# Patient Record
Sex: Male | Born: 1955 | Race: Black or African American | Hispanic: No | Marital: Married | Smoking: Current every day smoker
Health system: Southern US, Community
[De-identification: ages and names within clinical notes are randomized; demographics above are authoritative.]

## PROBLEM LIST (undated history)

## (undated) DIAGNOSIS — E785 Hyperlipidemia, unspecified: Secondary | ICD-10-CM

## (undated) HISTORY — DX: Hyperlipidemia, unspecified: E78.5

## (undated) HISTORY — PX: LIPOMA EXCISION: SHX5283

---

## 2000-03-11 ENCOUNTER — Emergency Department (HOSPITAL_COMMUNITY): Admission: EM | Admit: 2000-03-11 | Discharge: 2000-03-11 | Payer: Self-pay | Admitting: Emergency Medicine

## 2000-07-12 ENCOUNTER — Emergency Department (HOSPITAL_COMMUNITY): Admission: EM | Admit: 2000-07-12 | Discharge: 2000-07-12 | Payer: Self-pay | Admitting: Emergency Medicine

## 2000-07-27 ENCOUNTER — Emergency Department (HOSPITAL_COMMUNITY): Admission: EM | Admit: 2000-07-27 | Discharge: 2000-07-27 | Payer: Self-pay | Admitting: Emergency Medicine

## 2001-03-13 ENCOUNTER — Emergency Department (HOSPITAL_COMMUNITY): Admission: EM | Admit: 2001-03-13 | Discharge: 2001-03-13 | Payer: Self-pay | Admitting: *Deleted

## 2002-02-13 ENCOUNTER — Encounter: Payer: Self-pay | Admitting: Emergency Medicine

## 2002-02-13 ENCOUNTER — Emergency Department (HOSPITAL_COMMUNITY): Admission: EM | Admit: 2002-02-13 | Discharge: 2002-02-13 | Payer: Self-pay | Admitting: Emergency Medicine

## 2005-12-14 ENCOUNTER — Emergency Department (HOSPITAL_COMMUNITY): Admission: EM | Admit: 2005-12-14 | Discharge: 2005-12-14 | Payer: Self-pay | Admitting: Emergency Medicine

## 2007-04-07 ENCOUNTER — Encounter (HOSPITAL_BASED_OUTPATIENT_CLINIC_OR_DEPARTMENT_OTHER): Payer: Self-pay | Admitting: General Surgery

## 2007-04-07 ENCOUNTER — Ambulatory Visit (HOSPITAL_BASED_OUTPATIENT_CLINIC_OR_DEPARTMENT_OTHER): Admission: RE | Admit: 2007-04-07 | Discharge: 2007-04-07 | Payer: Self-pay | Admitting: General Surgery

## 2007-04-09 ENCOUNTER — Emergency Department (HOSPITAL_COMMUNITY): Admission: EM | Admit: 2007-04-09 | Discharge: 2007-04-09 | Payer: Self-pay | Admitting: Emergency Medicine

## 2007-04-19 ENCOUNTER — Emergency Department (HOSPITAL_COMMUNITY): Admission: EM | Admit: 2007-04-19 | Discharge: 2007-04-19 | Payer: Self-pay | Admitting: Emergency Medicine

## 2008-10-17 ENCOUNTER — Emergency Department (HOSPITAL_COMMUNITY): Admission: EM | Admit: 2008-10-17 | Discharge: 2008-10-17 | Payer: Self-pay | Admitting: Emergency Medicine

## 2009-08-02 ENCOUNTER — Emergency Department (HOSPITAL_COMMUNITY): Admission: EM | Admit: 2009-08-02 | Discharge: 2009-08-02 | Payer: Self-pay | Admitting: Emergency Medicine

## 2010-03-10 ENCOUNTER — Emergency Department (HOSPITAL_COMMUNITY): Admission: EM | Admit: 2010-03-10 | Discharge: 2010-03-10 | Payer: Self-pay | Admitting: Emergency Medicine

## 2010-04-29 ENCOUNTER — Encounter: Admission: RE | Admit: 2010-04-29 | Discharge: 2010-04-29 | Payer: Self-pay | Admitting: Otolaryngology

## 2010-08-19 ENCOUNTER — Emergency Department (HOSPITAL_COMMUNITY)
Admission: EM | Admit: 2010-08-19 | Discharge: 2010-08-19 | Disposition: A | Discharge status: Home / Self Care | Payer: Medicaid Other | Attending: Emergency Medicine | Admitting: Emergency Medicine

## 2010-08-19 DIAGNOSIS — R5381 Other malaise: Secondary | ICD-10-CM | POA: Insufficient documentation

## 2010-08-19 DIAGNOSIS — R109 Unspecified abdominal pain: Secondary | ICD-10-CM | POA: Insufficient documentation

## 2010-08-19 DIAGNOSIS — R197 Diarrhea, unspecified: Secondary | ICD-10-CM | POA: Insufficient documentation

## 2010-08-19 LAB — POCT I-STAT, CHEM 8
BUN: 12 mg/dL (ref 6–23)
Calcium, Ion: 1.11 mmol/L — ABNORMAL LOW (ref 1.12–1.32)
Chloride: 105 mEq/L (ref 96–112)
Creatinine, Ser: 1.2 mg/dL (ref 0.4–1.5)
Glucose, Bld: 88 mg/dL (ref 70–99)
HCT: 46 % (ref 39.0–52.0)
Hemoglobin: 15.6 g/dL (ref 13.0–17.0)
Potassium: 3.8 mEq/L (ref 3.5–5.1)
TCO2: 22 mmol/L (ref 0–100)

## 2010-10-22 LAB — DIFFERENTIAL
Eosinophils Absolute: 0.2 10*3/uL (ref 0.0–0.7)
Lymphocytes Relative: 23 % (ref 12–46)
Lymphs Abs: 3.1 10*3/uL (ref 0.7–4.0)
Monocytes Relative: 7 % (ref 3–12)
Neutro Abs: 9.3 10*3/uL — ABNORMAL HIGH (ref 1.7–7.7)
Neutrophils Relative %: 68 % (ref 43–77)

## 2010-10-22 LAB — CBC
MCV: 91.6 fL (ref 78.0–100.0)
Platelets: 310 10*3/uL (ref 150–400)
RBC: 4.21 MIL/uL — ABNORMAL LOW (ref 4.22–5.81)
WBC: 13.6 10*3/uL — ABNORMAL HIGH (ref 4.0–10.5)

## 2010-10-22 LAB — URINALYSIS, ROUTINE W REFLEX MICROSCOPIC
Bilirubin Urine: NEGATIVE
Glucose, UA: NEGATIVE mg/dL
Ketones, ur: NEGATIVE mg/dL
Nitrite: NEGATIVE
Specific Gravity, Urine: 1.018 (ref 1.005–1.030)
pH: 5.5 (ref 5.0–8.0)

## 2010-10-22 LAB — BASIC METABOLIC PANEL
BUN: 12 mg/dL (ref 6–23)
Chloride: 104 mEq/L (ref 96–112)
Creatinine, Ser: 0.84 mg/dL (ref 0.4–1.5)
GFR calc Af Amer: >60 mL/min (ref >60)
GFR calc non Af Amer: >60 mL/min (ref >60)

## 2010-10-22 LAB — ABO/RH: ABO/RH(D): O POS

## 2010-10-22 LAB — HEMOCCULT GUIAC POC 1CARD (OFFICE): Fecal Occult Bld: POSITIVE

## 2010-10-22 LAB — TYPE AND SCREEN: Antibody Screen: NEGATIVE

## 2010-10-22 LAB — PROTIME-INR
INR: 1 (ref 0.00–1.49)
Prothrombin Time: 13.7 seconds (ref 11.6–15.2)

## 2010-10-22 LAB — APTT: aPTT: 53 seconds — ABNORMAL HIGH (ref 24–37)

## 2010-11-25 NOTE — Op Note (Signed)
NAMECREIGHTON, LONGLEY                  ACCOUNT NO.:  [id number]   MEDICAL RECORD NO.:  [id number]          PATIENT TYPE:  AMB   LOCATION:  DSC                          FACILITY:  [facility]   PHYSICIAN:  Leonie Man, M.D.   DATE OF BIRTH:  October 24, 1955   DATE OF PROCEDURE:  04/07/2007  DATE OF DISCHARGE:                               OPERATIVE REPORT   PREOPERATIVE DIAGNOSIS:  Lipoma, posterior right shoulder.   POSTOPERATIVE DIAGNOSIS:  Lipoma, posterior right shoulder.   PROCEDURE:  Excision of lipoma, posterior right shoulder, estimated at  27 cm in greatest diameter.   SURGEON:  Leonie Man, M.D.   ASSISTANT:  OR nurse.   ANESTHESIA:  General.   INDICATION:  The patient is a 55 year old man with a very enlarging mass  over his right shoulder posteriorly which is now causing some pressure  symptoms and does not allow his clothing to fit correctly.  He comes to  the operating room now for removal of this very large lipoma after the  risks and potential benefits of surgery have been discussed, all  questions answered and consent obtained.   PROCEDURE:  The patient was positioned in the prone position following  induction of satisfactory anesthesia and his posterior right shoulder  was prepped and draped to be included in a sterile operative field.  Positive identification of the patient as Antonio Ball and the operation  as a right-sided posterior shoulder lipoma was carried out.  I then made  a transverse incision across the lipoma, deepening this through skin and  subcutaneous tissue and down to the capsule of the lipoma.  The capsule  was opened the entire length of the incision and a finger was inserted  into the capsule of the lipoma and using blunt dissection, most of this  was dissected free.  Hemostasis was assured with electrocautery.  Sponge  and instrument counts were verified.  The incision was closed in layers  using interrupted 3-0 Vicryl sutures to close the  subcutaneous tissue  and a running 4-0 Monocryl to close the skin.  The anesthetic was  reversed after sterile dressings were placed on the wound and the  patient removed from the operating room to the recovery room in stable  condition.  He tolerated the procedure well.      Leonie Man, M.D.  Electronically Signed     PB/MEDQ  D:  04/07/2007  T:  04/08/2007  Job:  [id number]

## 2011-02-05 ENCOUNTER — Emergency Department (HOSPITAL_COMMUNITY)
Admission: EM | Admit: 2011-02-05 | Discharge: 2011-02-05 | Disposition: A | Discharge status: Home / Self Care | Payer: Medicaid Other | Attending: Emergency Medicine | Admitting: Emergency Medicine

## 2011-02-05 DIAGNOSIS — L723 Sebaceous cyst: Secondary | ICD-10-CM | POA: Insufficient documentation

## 2011-04-23 LAB — POCT HEMOGLOBIN-HEMACUE
Hemoglobin: 14.7
Operator id: 141661

## 2012-03-24 ENCOUNTER — Encounter (INDEPENDENT_AMBULATORY_CARE_PROVIDER_SITE_OTHER): Payer: Self-pay | Admitting: Surgery

## 2012-04-12 ENCOUNTER — Ambulatory Visit (INDEPENDENT_AMBULATORY_CARE_PROVIDER_SITE_OTHER): Payer: Medicaid Other | Admitting: Surgery

## 2012-04-12 ENCOUNTER — Encounter (INDEPENDENT_AMBULATORY_CARE_PROVIDER_SITE_OTHER): Payer: Self-pay | Admitting: Surgery

## 2012-04-12 VITALS — BP 114/64 | HR 92 | Temp 97.5°F | Resp 20 | Ht 68.5 in | Wt 169.8 lb

## 2012-04-12 DIAGNOSIS — N498 Inflammatory disorders of other specified male genital organs: Secondary | ICD-10-CM

## 2012-04-12 DIAGNOSIS — N492 Inflammatory disorders of scrotum: Secondary | ICD-10-CM

## 2012-04-12 NOTE — Progress Notes (Signed)
Patient ID: Antonio Ball, male   DOB: April 04, 1956, 57 y.o.   MRN: [id number]  Chief Complaint  Patient presents with  . Pre-op Exam    eval lt groin mass    HPI Antonio Ball is a 56 y.o. male.   HPIHe is referred by Dr. Parke Simmers  For evaluation of a nodule near his left side of the scrotum. He is felt that for approximately 8 months. It also slight discomfort and has been getting larger it has never drained. He has no other complaints.  Past Medical History  Diagnosis Date  . Hyperlipidemia     Past Surgical History  Procedure Date  . Lipoma excision     History reviewed. No pertinent family history.  Social History History  Substance Use Topics  . Smoking status: Current Every Day Smoker -- 1.0 packs/day    Types: Cigarettes  . Smokeless tobacco: Not on file  . Alcohol Use: Yes     1 beer a month    No Known Allergies  Current Outpatient Prescriptions  Medication Sig Dispense Refill  . nicotine (NICODERM CQ - DOSED IN MG/24 HOURS) 14 mg/24hr patch Place 1 patch onto the skin daily.        Review of Systems Review of Systems  All other systems reviewed and are negative.    Blood pressure 114/64, pulse 92, temperature 97.5 F (36.4 C), temperature source Temporal, resp. rate 20, height 5' 8.5" (1.74 m), weight 169 lb 12.8 oz (77.021 kg).  Physical Exam Physical Exam  Constitutional: He is oriented to person, place, and time. He appears well-developed and well-nourished. No distress.  HENT:  Head: Normocephalic and atraumatic.  Right Ear: External ear normal.  Left Ear: External ear normal.  Nose: Nose normal.  Mouth/Throat: Oropharynx is clear and moist. No oropharyngeal exudate.  Eyes: Conjunctivae normal are normal. Pupils are equal, round, and reactive to light. Right eye exhibits no discharge. Left eye exhibits no discharge. No scleral icterus.  Neck: Normal range of motion. Neck supple. No tracheal deviation present. No thyromegaly present.    Cardiovascular: Normal rate, regular rhythm, normal heart sounds and intact distal pulses.   No murmur heard. Pulmonary/Chest: Effort normal and breath sounds normal. No respiratory distress. He has no wheezes. He has no rales.  Abdominal: Soft. Bowel sounds are normal. He exhibits no distension. There is no tenderness. There is no rebound and no guarding.  Genitourinary: Penis normal.       There is a 1 cm hard nodule which is mobile at the left lateral side of the scrotum right near the thigh. There is no erythema and minimal tenderness  There are no testicular masses  Musculoskeletal: Normal range of motion. He exhibits no edema and no tenderness.  Lymphadenopathy:    He has no cervical adenopathy.  Neurological: He is alert and oriented to person, place, and time. No cranial nerve deficit. Coordination normal.  Skin: Skin is warm and dry. He is not diaphoretic. No erythema. No pallor.  Psychiatric: His behavior is normal. Judgment normal.    Data Reviewed   Assessment    Left scrotal nodule    Plan    Removal of this is recommended for histologic evaluation to rule out malignancy. I discussed this with him in detail. I discussed the risks of surgery which includes but is not limited to bleeding, infection, injury to Surrounding structures, recurrence, need for further surgery, et Karie Soda. he understands and wishes to proceed. Surgery will be scheduled  [name] A 04/12/2012, 10:33 AM

## 2012-04-13 ENCOUNTER — Encounter (HOSPITAL_COMMUNITY): Payer: Self-pay | Admitting: Pharmacy Technician

## 2012-04-18 NOTE — Pre-Procedure Instructions (Signed)
20 RYAAN VANWAGONER  04/18/2012   Your procedure is scheduled on:  Wednesday, October 9th.  Report to [facility] at 8:15AM.  Call this number if you have problems the morning of surgery: [phone]   Remember:   Do not eat food or drink any liquid:After Midnight.      Take these medicines the morning of surgery with A SIP OF WATER: None   Do not wear jewelry, make-up or nail polish.  Do not wear lotions, powders, or perfumes. You may wear deodorant.  Do not shave 48 hours prior to surgery. Men may shave face and neck.  Do not bring valuables to the hospital.  Contacts, dentures or bridgework may not be worn into surgery.  Leave suitcase in the car. After surgery it may be brought to your room.  For patients admitted to the hospital, checkout time is 11:00 AM the day of discharge.   Patients discharged the day of surgery will not be allowed to drive home.  Name and phone number of your driver: ________________  Special Instructions: Shower using CHG 2 nights before surgery and the night before surgery.  If you shower the day of surgery use CHG.  Use special wash - you have one bottle of CHG for all showers.  You should use approximately 1/3 of the bottle for each shower.   Please read over the following fact sheets that you were given: Pain Booklet, Coughing and Deep Breathing and Surgical Site Infection Prevention

## 2012-04-19 ENCOUNTER — Encounter (HOSPITAL_COMMUNITY): Payer: Self-pay

## 2012-04-19 ENCOUNTER — Encounter (HOSPITAL_COMMUNITY)
Admission: RE | Admit: 2012-04-19 | Discharge: 2012-04-19 | Disposition: A | Discharge status: Home / Self Care | Payer: Medicaid Other | Source: Ambulatory Visit | Attending: Surgery | Admitting: Surgery

## 2012-04-19 ENCOUNTER — Other Ambulatory Visit (INDEPENDENT_AMBULATORY_CARE_PROVIDER_SITE_OTHER): Payer: Self-pay | Admitting: Surgery

## 2012-04-19 LAB — CBC
HCT: 41.9 % (ref 39.0–52.0)
Hemoglobin: 14.1 g/dL (ref 13.0–17.0)
MCH: 30.9 pg (ref 26.0–34.0)
MCHC: 33.7 g/dL (ref 30.0–36.0)
MCV: 91.7 fL (ref 78.0–100.0)
RBC: 4.57 MIL/uL (ref 4.22–5.81)

## 2012-04-19 LAB — SURGICAL PCR SCREEN: MRSA, PCR: NEGATIVE

## 2012-04-19 MED ORDER — CEFAZOLIN SODIUM-DEXTROSE 2-3 GM-% IV SOLR
2.0000 g | INTRAVENOUS | Status: AC
  Administered 2012-04-20: 2 g via INTRAVENOUS

## 2012-04-19 NOTE — H&P (Signed)
Patient ID: Antonio Ball, male DOB: 15-Aug-1955, 56 y.o. MRN: [id number]  Chief Complaint   Patient presents with   .  Pre-op Exam     eval lt groin mass    HPI  Antonio Ball is a 56 y.o. male.  HPIHe is referred by Dr. Parke Simmers For evaluation of a nodule near his left side of the scrotum. He is felt that for approximately 8 months. It also slight discomfort and has been getting larger it has never drained. He has no other complaints.  Past Medical History   Diagnosis  Date   .  Hyperlipidemia     Past Surgical History   Procedure  Date   .  Lipoma excision     History reviewed. No pertinent family history.  Social History  History   Substance Use Topics   .  Smoking status:  Current Every Day Smoker -- 1.0 packs/day     Types:  Cigarettes   .  Smokeless tobacco:  Not on file   .  Alcohol Use:  Yes      1 beer a month    No Known Allergies  Current Outpatient Prescriptions   Medication  Sig  Dispense  Refill   .  nicotine (NICODERM CQ - DOSED IN MG/24 HOURS) 14 mg/24hr patch  Place 1 patch onto the skin daily.      Review of Systems  Review of Systems  All other systems reviewed and are negative.   Blood pressure 114/64, pulse 92, temperature 97.5 F (36.4 C), temperature source Temporal, resp. rate 20, height 5' 8.5" (1.74 m), weight 169 lb 12.8 oz (77.021 kg).  Physical Exam  Physical Exam  Constitutional: He is oriented to person, place, and time. He appears well-developed and well-nourished. No distress.  HENT:  Head: Normocephalic and atraumatic.  Right Ear: External ear normal.  Left Ear: External ear normal.  Nose: Nose normal.  Mouth/Throat: Oropharynx is clear and moist. No oropharyngeal exudate.  Eyes: Conjunctivae normal are normal. Pupils are equal, round, and reactive to light. Right eye exhibits no discharge. Left eye exhibits no discharge. No scleral icterus.  Neck: Normal range of motion. Neck supple. No tracheal deviation present. No thyromegaly  present.  Cardiovascular: Normal rate, regular rhythm, normal heart sounds and intact distal pulses.  No murmur heard.  Pulmonary/Chest: Effort normal and breath sounds normal. No respiratory distress. He has no wheezes. He has no rales.  Abdominal: Soft. Bowel sounds are normal. He exhibits no distension. There is no tenderness. There is no rebound and no guarding.  Genitourinary: Penis normal.  There is a 1 cm hard nodule which is mobile at the left lateral side of the scrotum right near the thigh. There is no erythema and minimal tenderness  There are no testicular masses  Musculoskeletal: Normal range of motion. He exhibits no edema and no tenderness.  Lymphadenopathy:  He has no cervical adenopathy.  Neurological: He is alert and oriented to person, place, and time. No cranial nerve deficit. Coordination normal.  Skin: Skin is warm and dry. He is not diaphoretic. No erythema. No pallor.  Psychiatric: His behavior is normal. Judgment normal.   Data Reviewed  Assessment   Left scrotal nodule   Plan   Removal of this is recommended for histologic evaluation to rule out malignancy. I discussed this with him in detail. I discussed the risks of surgery which includes but is not limited to bleeding, infection, injury to Surrounding structures, recurrence, need for  further surgery, et Karie Soda. he understands and wishes to proceed. Surgery will be scheduled   Antonio Ball A

## 2012-04-20 ENCOUNTER — Ambulatory Visit (HOSPITAL_COMMUNITY)
Admission: RE | Admit: 2012-04-20 | Discharge: 2012-04-20 | Disposition: A | Discharge status: Home / Self Care | Payer: Medicaid Other | Source: Ambulatory Visit | Attending: Surgery | Admitting: Surgery

## 2012-04-20 ENCOUNTER — Encounter (HOSPITAL_COMMUNITY)
Admission: RE | Disposition: A | Discharge status: Home / Self Care | Payer: Self-pay | Source: Ambulatory Visit | Attending: Surgery

## 2012-04-20 ENCOUNTER — Encounter (HOSPITAL_COMMUNITY): Payer: Self-pay | Admitting: Anesthesiology

## 2012-04-20 ENCOUNTER — Ambulatory Visit (HOSPITAL_COMMUNITY): Payer: Medicaid Other | Admitting: Anesthesiology

## 2012-04-20 DIAGNOSIS — F17200 Nicotine dependence, unspecified, uncomplicated: Secondary | ICD-10-CM | POA: Insufficient documentation

## 2012-04-20 DIAGNOSIS — L723 Sebaceous cyst: Secondary | ICD-10-CM | POA: Insufficient documentation

## 2012-04-20 DIAGNOSIS — Z01812 Encounter for preprocedural laboratory examination: Secondary | ICD-10-CM | POA: Insufficient documentation

## 2012-04-20 DIAGNOSIS — E785 Hyperlipidemia, unspecified: Secondary | ICD-10-CM | POA: Insufficient documentation

## 2012-04-20 HISTORY — PX: MASS EXCISION: SHX2000

## 2012-04-20 SURGERY — EXCISION MASS
Anesthesia: Choice | Site: Scrotum | Laterality: Left | Wound class: Clean

## 2012-04-20 MED ORDER — OXYCODONE HCL 5 MG PO TABS
ORAL_TABLET | ORAL | Status: AC
  Filled 2012-04-20: qty 2

## 2012-04-20 MED ORDER — LIDOCAINE HCL (PF) 1 % IJ SOLN
INTRAMUSCULAR | Status: AC
  Filled 2012-04-20: qty 30

## 2012-04-20 MED ORDER — FENTANYL CITRATE 0.05 MG/ML IJ SOLN
INTRAMUSCULAR | Status: DC | PRN
  Administered 2012-04-20: 50 ug via INTRAVENOUS

## 2012-04-20 MED ORDER — 0.9 % SODIUM CHLORIDE (POUR BTL) OPTIME
TOPICAL | Status: DC | PRN
  Administered 2012-04-20: 1000 mL

## 2012-04-20 MED ORDER — BUPIVACAINE-EPINEPHRINE 0.25% -1:200000 IJ SOLN
INTRAMUSCULAR | Status: DC | PRN
  Administered 2012-04-20: 10 mL

## 2012-04-20 MED ORDER — MIDAZOLAM HCL 5 MG/5ML IJ SOLN
INTRAMUSCULAR | Status: DC | PRN
  Administered 2012-04-20: 2 mg via INTRAVENOUS

## 2012-04-20 MED ORDER — PROPOFOL 10 MG/ML IV BOLUS
INTRAVENOUS | Status: DC | PRN
  Administered 2012-04-20: 10 mg via INTRAVENOUS

## 2012-04-20 MED ORDER — LIDOCAINE HCL (PF) 1 % IJ SOLN
INTRAMUSCULAR | Status: DC | PRN
  Administered 2012-04-20: 10 mL

## 2012-04-20 MED ORDER — BUPIVACAINE-EPINEPHRINE PF 0.25-1:200000 % IJ SOLN
INTRAMUSCULAR | Status: AC
  Filled 2012-04-20: qty 30

## 2012-04-20 MED ORDER — LACTATED RINGERS IV SOLN
INTRAVENOUS | Status: DC | PRN
  Administered 2012-04-20: 10:00:00 via INTRAVENOUS

## 2012-04-20 MED ORDER — OXYCODONE HCL 5 MG PO TABS
5.0000 mg | ORAL_TABLET | ORAL | Status: DC | PRN
  Administered 2012-04-20: 10 mg via ORAL

## 2012-04-20 MED ORDER — HYDROMORPHONE HCL PF 1 MG/ML IJ SOLN: 0.2500 mg | INTRAMUSCULAR | Status: DC | PRN

## 2012-04-20 MED ORDER — CEFAZOLIN SODIUM-DEXTROSE 2-3 GM-% IV SOLR
INTRAVENOUS | Status: AC
  Filled 2012-04-20: qty 50

## 2012-04-20 MED ORDER — HYDROCODONE-ACETAMINOPHEN 5-325 MG PO TABS: 1.0000 | ORAL_TABLET | ORAL | Status: DC | PRN

## 2012-04-20 MED ORDER — KETOROLAC TROMETHAMINE 30 MG/ML IJ SOLN
INTRAMUSCULAR | Status: DC | PRN
  Administered 2012-04-20: 30 mg via INTRAVENOUS

## 2012-04-20 MED ORDER — LACTATED RINGERS IV SOLN
INTRAVENOUS | Status: DC
  Administered 2012-04-20: 10:00:00 via INTRAVENOUS

## 2012-04-20 SURGICAL SUPPLY — 40 items
BENZOIN TINCTURE PRP APPL 2/3 (GAUZE/BANDAGES/DRESSINGS) ×2 IMPLANT
BLADE SURG 10 STRL SS (BLADE) ×2 IMPLANT
BLADE SURG 15 STRL LF DISP TIS (BLADE) ×1 IMPLANT
BLADE SURG 15 STRL SS (BLADE) ×1
CANISTER SUCTION 2500CC (MISCELLANEOUS) IMPLANT
CLOTH BEACON ORANGE TIMEOUT ST (SAFETY) ×2 IMPLANT
COVER SURGICAL LIGHT HANDLE (MISCELLANEOUS) ×2 IMPLANT
DECANTER SPIKE VIAL GLASS SM (MISCELLANEOUS) ×2 IMPLANT
DRAPE LAPAROSCOPIC ABDOMINAL (DRAPES) IMPLANT
DRAPE PED LAPAROTOMY (DRAPES) ×2 IMPLANT
ELECT CAUTERY BLADE 6.4 (BLADE) ×2 IMPLANT
ELECT REM PT RETURN 9FT ADLT (ELECTROSURGICAL) ×2
ELECTRODE REM PT RTRN 9FT ADLT (ELECTROSURGICAL) ×1 IMPLANT
GLOVE BIO SURGEON STRL SZ7.5 (GLOVE) ×2 IMPLANT
GLOVE BIOGEL PI IND STRL 7.5 (GLOVE) ×1 IMPLANT
GLOVE BIOGEL PI INDICATOR 7.5 (GLOVE) ×1
GLOVE ECLIPSE 7.5 STRL STRAW (GLOVE) ×2 IMPLANT
GLOVE SURG SIGNA 7.5 PF LTX (GLOVE) ×2 IMPLANT
GOWN PREVENTION PLUS XLARGE (GOWN DISPOSABLE) ×2 IMPLANT
GOWN STRL NON-REIN LRG LVL3 (GOWN DISPOSABLE) ×2 IMPLANT
KIT BASIN OR (CUSTOM PROCEDURE TRAY) ×2 IMPLANT
KIT ROOM TURNOVER OR (KITS) ×2 IMPLANT
NEEDLE HYPO 25X1 1.5 SAFETY (NEEDLE) ×2 IMPLANT
NS IRRIG 1000ML POUR BTL (IV SOLUTION) ×2 IMPLANT
PACK SURGICAL SETUP 50X90 (CUSTOM PROCEDURE TRAY) ×2 IMPLANT
PAD ARMBOARD 7.5X6 YLW CONV (MISCELLANEOUS) ×4 IMPLANT
PENCIL BUTTON HOLSTER BLD 10FT (ELECTRODE) ×2 IMPLANT
SPECIMEN JAR SMALL (MISCELLANEOUS) ×2 IMPLANT
SPONGE GAUZE 4X4 12PLY (GAUZE/BANDAGES/DRESSINGS) ×2 IMPLANT
SPONGE LAP 18X18 X RAY DECT (DISPOSABLE) ×2 IMPLANT
STRIP CLOSURE SKIN 1/2X4 (GAUZE/BANDAGES/DRESSINGS) ×2 IMPLANT
SUT MNCRL AB 4-0 PS2 18 (SUTURE) ×2 IMPLANT
SUT VIC AB 3-0 SH 27 (SUTURE) ×1
SUT VIC AB 3-0 SH 27XBRD (SUTURE) ×1 IMPLANT
SYR BULB 3OZ (MISCELLANEOUS) ×2 IMPLANT
SYR CONTROL 10ML LL (SYRINGE) ×2 IMPLANT
TOWEL OR 17X24 6PK STRL BLUE (TOWEL DISPOSABLE) ×2 IMPLANT
TOWEL OR 17X26 10 PK STRL BLUE (TOWEL DISPOSABLE) ×2 IMPLANT
TUBE CONNECTING 12X1/4 (SUCTIONS) IMPLANT
YANKAUER SUCT BULB TIP NO VENT (SUCTIONS) IMPLANT

## 2012-04-20 NOTE — Interval H&P Note (Signed)
History and Physical Interval Note: no change in H and P  04/20/2012 8:34 AM  Antonio Ball  has presented today for surgery, with the diagnosis of excision left scrotal nodule  The various methods of treatment have been discussed with the patient and family. After consideration of risks, benefits and other options for treatment, the patient has consented to  Procedure(s) (LRB) with comments: EXCISION MASS (Left) - excision left scrotal nodule as a surgical intervention .  The patient's history has been reviewed, patient examined, no change in status, stable for surgery.  I have reviewed the patient's chart and labs.  Questions were answered to the patient's satisfaction.     [name] A

## 2012-04-20 NOTE — Op Note (Signed)
EXCISION MASS  Procedure Note  WINDELL MUSSON 04/20/2012   Pre-op Diagnosis: excision left scrotal nodule     Post-op Diagnosis: same  Procedure(s): Excision 2cm left scrotal nodule  Surgeon(s): Shelly Rubenstein, MD  Anesthesia: Choice  Staff:  Doy Mince, RN - Circulator Janeece Agee Pingue, CST - Scrub Person Hardin Negus, RN - Circulator  Estimated Blood Loss: Minimal               Specimens: sent to path.  ?sebaceous cyst         Procedure: The patient was brought to the operating room and identified as the correct patient. He was placed supine on the operating room table and anesthesia was induced. The left side of the scrotum and thigh were then prepped and draped in the usual sterile fashion. I anesthetized the skin 1% lidocaine. I did perform an elliptical incision around the nodule which was approximately 2 cm in size. The nodules completely removed with the cautery. It appeared consistent with a sebaceous cyst. It was sent to pathology for evaluation. I anesthetized the wound for marking. I achieved hemostasis with the cautery. I then closed the incision with interrupted 2-0 nylon sutures. Gauze and tape were applied. The patient tolerated the procedure well. All the counts were correct at the end of the procedure. The patient was then taken in a stable condition to the recovery room. Winston Misner A   Date: 04/20/2012  Time: 10:50 AM

## 2012-04-20 NOTE — Anesthesia Preprocedure Evaluation (Addendum)
Anesthesia Evaluation  Patient identified by MRN, date of birth, ID band Patient awake    Reviewed: Allergy & Precautions, H&P , NPO status , Patient's Chart, lab work & pertinent test results  Airway Mallampati: II TM Distance: >3 FB Neck ROM: Full    Dental  (+) Teeth Intact and Missing   Pulmonary Current Smoker,          Cardiovascular     Neuro/Psych    GI/Hepatic   Endo/Other    Renal/GU      Musculoskeletal   Abdominal   Peds  Hematology   Anesthesia Other Findings   Reproductive/Obstetrics                           Anesthesia Physical Anesthesia Plan  ASA: II  Anesthesia Plan:    Post-op Pain Management:    Induction:   Airway Management Planned:   Additional Equipment:   Intra-op Plan:   Post-operative Plan:   Informed Consent:   Plan Discussed with:   Anesthesia Plan Comments:         Anesthesia Quick Evaluation

## 2012-04-20 NOTE — Transfer of Care (Signed)
Immediate Anesthesia Transfer of Care Note  Patient: Antonio Ball  Procedure(s) Performed: Procedure(s) (LRB) with comments: EXCISION MASS (Left) - excision left scrotal nodule  Patient Location: PACU  Anesthesia Type: MAC  Level of Consciousness: awake, alert , oriented and patient cooperative  Airway & Oxygen Therapy: Patient Spontanous Breathing  Post-op Assessment: Report given to PACU RN, Post -op Vital signs reviewed and stable and Patient moving all extremities X 4  Post vital signs: Reviewed and stable  Complications: No apparent anesthesia complications

## 2012-04-20 NOTE — Anesthesia Postprocedure Evaluation (Signed)
  Anesthesia Post-op Note  Patient: Antonio Ball  Procedure(s) Performed: Procedure(s) (LRB) with comments: EXCISION MASS (Left) - excision left scrotal nodule  Patient Location: PACU  Anesthesia Type: MAC  Level of Consciousness: awake  Airway and Oxygen Therapy: Patient Spontanous Breathing  Post-op Pain: mild  Post-op Assessment: Post-op Vital signs reviewed  Post-op Vital Signs: Reviewed  Complications: No apparent anesthesia complications

## 2012-04-22 ENCOUNTER — Encounter (HOSPITAL_COMMUNITY): Payer: Self-pay | Admitting: Surgery

## 2012-05-03 ENCOUNTER — Encounter (INDEPENDENT_AMBULATORY_CARE_PROVIDER_SITE_OTHER): Payer: Self-pay | Admitting: Surgery

## 2012-05-03 ENCOUNTER — Ambulatory Visit (INDEPENDENT_AMBULATORY_CARE_PROVIDER_SITE_OTHER): Payer: Medicaid Other | Admitting: Surgery

## 2012-05-03 VITALS — BP 132/80 | HR 90 | Temp 97.8°F | Resp 18 | Ht 67.0 in | Wt 175.4 lb

## 2012-05-03 DIAGNOSIS — Z09 Encounter for follow-up examination after completed treatment for conditions other than malignant neoplasm: Secondary | ICD-10-CM

## 2012-05-03 NOTE — Progress Notes (Signed)
Subjective:     Patient ID: Antonio Ball, male   DOB: 1955/09/15, 56 y.o.   MRN: [id number]  HPI He is here for his first postop visit status post excision of a scrotal nodule He has no complaints Review of Systems     Objective:   Physical Exam The incision is healing okay. I removed the sutures   the final pathology showed a benign inclusion cyst Assessment:     Patient stable postop    Plan:     I will see him back as needed.  I told him to expect drainage

## 2013-08-23 ENCOUNTER — Emergency Department (HOSPITAL_COMMUNITY)
Admission: EM | Admit: 2013-08-23 | Discharge: 2013-08-23 | Disposition: A | Discharge status: Home / Self Care | Payer: Medicaid Other | Attending: Emergency Medicine | Admitting: Emergency Medicine

## 2013-08-23 ENCOUNTER — Encounter (HOSPITAL_COMMUNITY): Payer: Self-pay | Admitting: Emergency Medicine

## 2013-08-23 DIAGNOSIS — F17200 Nicotine dependence, unspecified, uncomplicated: Secondary | ICD-10-CM | POA: Insufficient documentation

## 2013-08-23 DIAGNOSIS — S61209A Unspecified open wound of unspecified finger without damage to nail, initial encounter: Secondary | ICD-10-CM | POA: Insufficient documentation

## 2013-08-23 DIAGNOSIS — Z23 Encounter for immunization: Secondary | ICD-10-CM | POA: Insufficient documentation

## 2013-08-23 DIAGNOSIS — Z8639 Personal history of other endocrine, nutritional and metabolic disease: Secondary | ICD-10-CM | POA: Insufficient documentation

## 2013-08-23 DIAGNOSIS — Z862 Personal history of diseases of the blood and blood-forming organs and certain disorders involving the immune mechanism: Secondary | ICD-10-CM | POA: Insufficient documentation

## 2013-08-23 DIAGNOSIS — Y9389 Activity, other specified: Secondary | ICD-10-CM | POA: Insufficient documentation

## 2013-08-23 DIAGNOSIS — S61213A Laceration without foreign body of left middle finger without damage to nail, initial encounter: Secondary | ICD-10-CM

## 2013-08-23 DIAGNOSIS — IMO0002 Reserved for concepts with insufficient information to code with codable children: Secondary | ICD-10-CM | POA: Insufficient documentation

## 2013-08-23 DIAGNOSIS — Y929 Unspecified place or not applicable: Secondary | ICD-10-CM | POA: Insufficient documentation

## 2013-08-23 MED ORDER — TETANUS-DIPHTH-ACELL PERTUSSIS 5-2.5-18.5 LF-MCG/0.5 IM SUSP
0.5000 mL | Freq: Once | INTRAMUSCULAR | Status: AC
  Administered 2013-08-23: 0.5 mL via INTRAMUSCULAR
  Filled 2013-08-23: qty 0.5

## 2013-08-23 NOTE — ED Notes (Signed)
Pt. Stated, i got cut with a chain saw, while cutting trees.  4 hours ago.Left  Middle finger with 3 areas of laceration.

## 2013-08-23 NOTE — Discharge Instructions (Signed)
1. Medications: tylenol/ibuprofen for pain, usual home medications 2. Treatment: rest, drink plenty of fluids, ice for swelling, keep wound clean and bandage dry 3. Follow Up: Please followup with your primary doctor, UC or here in the ED for discussion of your diagnoses and further evaluation after today's visit;    Sutured Wound Care Sutures are stitches that can be used to close wounds. Caring for your wound can help stop infection and lessen pain. HOME CARE   Rest and raise (elevate) the injured area until the pain and puffiness (swelling) go away.  Only take medicines as told by your doctor.  Clean the wound gently with mild soap and water once a day after the first 2 days. Rinse off the soap. Pat the area dry with a clean towel. Do not rub the wound.  Change the bandage (dressing) as told by your doctor. If the bandage sticks, soak it off with soapy water. Stop using a bandage after 2 days or after the wound stops leaking fluid.  Put cream on the wound as told by your doctor.  Do not stretch the wound.  Drink enough fluids to keep your pee (urine) clear or pale yellow.  See your doctor to have the sutures removed.  Use sunscreen or sunblock on the wound after it heals. GET HELP RIGHT AWAY IF:   Your wound gets red, puffy, hot, or tender.  You have more pain in the wound.  You have a red streak that goes away from the wound.  You see yellowish-white fluid (pus) coming out of the wound.  You have a fever.  You have chills and start to shake.  You notice a bad smell coming from the wound.  Your wound will not stop bleeding. MAKE SURE YOU:   Understand these instructions.  Will watch your condition.  Will get help right away if you are not doing well or get worse. Document Released: 12/16/2007 Document Revised: 09/21/2011 Document Reviewed: 11/02/2010 [facility] Patient Information 2014 [place], [place].

## 2013-08-23 NOTE — ED Notes (Signed)
PT ambulated with baseline gait; VSS; A&Ox3; no signs of distress; respirations even and unlabored; skin warm and dry; no questions upon discharge.  

## 2013-08-23 NOTE — ED Notes (Signed)
Pt hit L 2-4th digits on chainsaw approx 4 hours ago.  Small laceration to 2nd and 4th digit.  Approx 1/2 inch laceration to 3rd digit. States he was pulling a limb out of a tree and someone was behind him with the chainsaw.  Pt has fingers duct taped on arrived.  Bleeding controlled. Unknown last DT.

## 2013-08-23 NOTE — ED Provider Notes (Signed)
CSN: [id number]     Arrival date & time 08/23/13  1747 History   This chart was scribed for non-physician practitioner Dierdre ForthHannah Colon Rueth, PA-C working with Shelda JakesScott W. Zackowski, MD by Donne Anonayla Curran, ED Scribe. This patient was seen in room TR08C/TR08C and the patient's care was started at 1826.   First MD Initiated Contact with Patient 08/23/13 1826     Chief Complaint  Patient presents with  . Extremity Laceration      The history is provided by the patient and medical records. No language interpreter was used.   HPI Comments: Antonio Ball is a 58 y.o. male who presents to the Emergency Department complaining of a laceration to his left middle finger that occurred immediately PTA when he accidentally grazed his finger with a chain saw. He denies numbness or tingling in his hand or any other symptoms. His tetanus shot is not UTD.  He did not attempt any pain control prior to arrival. He obtained hemostasis with duct tape.  Pt denies fever, chills, nausea and vomiting.     Past Medical History  Diagnosis Date  . Hyperlipidemia    Past Surgical History  Procedure Laterality Date  . Lipoma excision      Shoulder  . Mass excision  04/20/2012    Procedure: EXCISION MASS;  Surgeon: Shelly Rubensteinouglas A Blackman, MD;  Location: [facility] OR;  Service: General;  Laterality: Left;  excision left scrotal nodule   No family history on file. History  Substance Use Topics  . Smoking status: Current Every Day Smoker -- 0.50 packs/day for 20 years    Types: Cigarettes  . Smokeless tobacco: Not on file  . Alcohol Use: Yes     Comment: 1 beer a month    Review of Systems  Constitutional: Negative for fever, diaphoresis, appetite change, fatigue and unexpected weight change.  HENT: Negative for mouth sores.   Eyes: Negative for visual disturbance.  Respiratory: Negative for cough, chest tightness, shortness of breath and wheezing.   Cardiovascular: Negative for chest pain.  Gastrointestinal: Negative for  nausea, vomiting, abdominal pain, diarrhea and constipation.  Endocrine: Negative for polydipsia, polyphagia and polyuria.  Genitourinary: Negative for dysuria, urgency, frequency and hematuria.  Musculoskeletal: Negative for back pain and neck stiffness.  Skin: Positive for wound. Negative for rash.  Allergic/Immunologic: Negative for immunocompromised state.  Neurological: Negative for syncope, light-headedness and headaches.  Hematological: Does not bruise/bleed easily.  Psychiatric/Behavioral: Negative for sleep disturbance. The patient is not nervous/anxious.       Allergies  Review of patient's allergies indicates no known allergies.  Home Medications  No current outpatient prescriptions on file.  BP 121/76  Pulse 97  Temp(Src) 97.9 F (36.6 C) (Oral)  Resp 19  SpO2 92%  Physical Exam  Nursing note and vitals reviewed. Constitutional: He is oriented to person, place, and time. He appears well-developed and well-nourished. No distress.  HENT:  Head: Normocephalic and atraumatic.  Eyes: Conjunctivae are normal. No scleral icterus.  Neck: Normal range of motion.  Cardiovascular: Normal rate, regular rhythm, normal heart sounds and intact distal pulses.   No murmur heard. Capillary refill < 3 sec  Pulmonary/Chest: Effort normal and breath sounds normal. No respiratory distress.  Musculoskeletal: Normal range of motion. He exhibits no edema.  Full ROM of left middle finger  Neurological: He is alert and oriented to person, place, and time.  Sensation: intact Strength: normal  Skin: Skin is warm and dry. He is not diaphoretic.  2.5 cm laceration  to the distal palmar aspect at the pad of the left middle finger. Strength 5/5 with resisted flexion and extension. Cap refill less than 3 seconds. Good strong grip strength.   Psychiatric: He has a normal mood and affect.    ED Course  LACERATION REPAIR Date/Time: 08/23/2013 8:05 PM Performed by: Dierdre Forth Authorized by: Dierdre Forth Consent: Verbal consent obtained. Risks and benefits: risks, benefits and alternatives were discussed Consent given by: patient Patient understanding: patient states understanding of the procedure being performed Patient consent: the patient's understanding of the procedure matches consent given Procedure consent: procedure consent matches procedure scheduled Relevant documents: relevant documents present and verified Site marked: the operative site was marked Imaging studies: imaging studies available Required items: required blood products, implants, devices, and special equipment available Patient identity confirmed: verbally with patient and arm band Time out: Immediately prior to procedure a "time out" was called to verify the correct patient, procedure, equipment, support staff and site/side marked as required. Body area: upper extremity Location details: left long finger Laceration length: 2.5 cm Contamination: The wound is contaminated. Foreign bodies: no foreign bodies Tendon involvement: none Nerve involvement: none Vascular damage: no Anesthesia: digital block Local anesthetic: lidocaine 2% without epinephrine Anesthetic total: 2 ml Patient sedated: no Preparation: Patient was prepped and draped in the usual sterile fashion. Irrigation solution: saline Irrigation method: syringe Amount of cleaning: extensive Debridement: none Degree of undermining: none Skin closure: 6-0 Prolene Number of sutures: 8 Technique: simple Approximation: close Approximation difficulty: complex Dressing: 4x4 sterile gauze Patient tolerance: Patient tolerated the procedure well with no immediate complications.   (including critical care time) DIAGNOSTIC STUDIES: Oxygen Saturation is 92% on RA, adequate by my interpretation.    COORDINATION OF CARE: 6:53 PM Discussed treatment plan which includes tetanus shot and suturing with pt at bedside and  pt agreed to plan. Advised pt to keep stitches clean with warm soap and water and to keep area dry. Advised pt to follow up with PCP or UC in 7-10 days to have stitches removed.    Labs Review Labs Reviewed - No data to display Imaging Review No results found.  EKG Interpretation   None       MDM   Final diagnoses:  Laceration of left middle finger w/o foreign body w/o damage to nail    Antonio Riffle presents with Left finger laceration.  Tdap booster given.  Pressure irrigation performed. Laceration occurred < 8 hours prior to repair which was well tolerated. Pt has no co morbidities to effect normal wound healing. Discussed suture home care w pt and answered questions. Pt to f-u for wound check and suture removal in 7 days. Pt is hemodynamically stable w no complaints prior to dc.    It has been determined that no acute conditions requiring further emergency intervention are present at this time. The patient/guardian have been advised of the diagnosis and plan. We have discussed signs and symptoms that warrant return to the ED, such as changes or worsening in symptoms.   Vital signs are stable at discharge.   BP 121/76  Pulse 97  Temp(Src) 97.9 F (36.6 C) (Oral)  Resp 19  SpO2 92%  Patient/guardian has voiced understanding and agreed to follow-up with the PCP or specialist.       Dierdre Forth, PA-C 08/23/13 2009

## 2013-08-24 NOTE — ED Provider Notes (Signed)
Medical screening examination/treatment/procedure(s) were performed by non-physician practitioner and as supervising physician I was immediately available for consultation/collaboration.  EKG Interpretation   None         Elishua Radford W. Savayah Waltrip, MD 08/24/13 1640 

## 2014-07-27 ENCOUNTER — Emergency Department (HOSPITAL_COMMUNITY)
Admission: EM | Admit: 2014-07-27 | Discharge: 2014-07-27 | Disposition: A | Discharge status: Home / Self Care | Payer: Medicaid Other | Attending: Emergency Medicine | Admitting: Emergency Medicine

## 2014-07-27 ENCOUNTER — Emergency Department (HOSPITAL_COMMUNITY): Payer: Medicaid Other

## 2014-07-27 ENCOUNTER — Encounter (HOSPITAL_COMMUNITY): Payer: Self-pay | Admitting: Physical Medicine and Rehabilitation

## 2014-07-27 DIAGNOSIS — Y9389 Activity, other specified: Secondary | ICD-10-CM | POA: Insufficient documentation

## 2014-07-27 DIAGNOSIS — T1490XA Injury, unspecified, initial encounter: Secondary | ICD-10-CM

## 2014-07-27 DIAGNOSIS — Z8639 Personal history of other endocrine, nutritional and metabolic disease: Secondary | ICD-10-CM | POA: Diagnosis not present

## 2014-07-27 DIAGNOSIS — Z720 Tobacco use: Secondary | ICD-10-CM | POA: Insufficient documentation

## 2014-07-27 DIAGNOSIS — S6392XA Sprain of unspecified part of left wrist and hand, initial encounter: Secondary | ICD-10-CM | POA: Insufficient documentation

## 2014-07-27 DIAGNOSIS — W208XXA Other cause of strike by thrown, projected or falling object, initial encounter: Secondary | ICD-10-CM | POA: Insufficient documentation

## 2014-07-27 DIAGNOSIS — Y998 Other external cause status: Secondary | ICD-10-CM | POA: Diagnosis not present

## 2014-07-27 DIAGNOSIS — S6992XA Unspecified injury of left wrist, hand and finger(s), initial encounter: Secondary | ICD-10-CM | POA: Diagnosis present

## 2014-07-27 DIAGNOSIS — Y9289 Other specified places as the place of occurrence of the external cause: Secondary | ICD-10-CM | POA: Diagnosis not present

## 2014-07-27 MED ORDER — IBUPROFEN 800 MG PO TABS: 800.0000 mg | ORAL_TABLET | Freq: Three times a day (TID) | ORAL | Status: DC

## 2014-07-27 NOTE — Progress Notes (Signed)
Orthopedic Tech Progress Note Patient Details:  Pricilla RiffleMichael M Antu [birth year] [id number]  Ortho Devices Type of Ortho Device: Velcro wrist splint Ortho Device/Splint Location: lue Ortho Device/Splint Interventions: Application   Raidyn Wassink 07/27/2014, 1:29 PM

## 2014-07-27 NOTE — ED Notes (Signed)
Called ortho to bring cock-up splint to place on patient.

## 2014-07-27 NOTE — Discharge Instructions (Signed)
Intermetacarpal Sprain °The intermetacarpal ligaments run between the knuckles at the base of the fingers. These ligaments are vulnerable to sprain and injury in which the ligament becomes overstretched or torn. Intermetacarpal sprains are classified into 3 categories. Grade 1 sprains cause pain, but the tendon is not lengthened. Grade 2 sprains include a lengthened ligament, due to the ligament being stretched or partially ruptured. With grade 2 sprains there is still function, although function may be decreased. Grade 3 sprains include a complete tear of the ligament, and the joint usually displays a loss of function.  °SYMPTOMS  °· Severe pain at the time of injury. °· Often, a feeling of popping or tearing inside the hand. °· Tenderness and inflammation at the knuckles. °· Bruising within a couple days of injury. °· Impaired ability to use the hand. °CAUSES  °This condition occurs when the intermetacarpal ligaments are subjected to a greater stress than they can handle. This causes the ligaments to become stretched or torn. °RISK INCREASES WITH: °· Previous hand injury. °· Fighting sports (boxing, wrestling, martial arts). °· Sports in which you could fall on an outstretched hand (soccer, basketball, volleyball). °· Other sports with repeated hand trauma (water polo, gymnastics). °· Poor hand strength and flexibility. °· Inadequate or poorly fitted protective equipment. °PREVENTION  °· Warm up and stretch properly before activity. °· Maintain appropriate conditioning: °¨ Hand flexibility. °¨ Muscle strength and endurance. °· Applying tape, protective strapping, or a brace may help prevent injury. °· Provide the hand with support during sports and practice activities for 6 to 12 months following injury. °PROGNOSIS  °With proper treatment, healing should occur without impairment. The length of healing varies from 2 to 12 weeks, depending on the severity of injury. °RELATED COMPLICATIONS  °· Longer healing time, if  activities are resumed too soon. °· Recurring symptoms or repeated injury, resulting in a chronic problem. °· Injury to other nearby structures (bone, cartilage, tendon). °· Arthritis of the knuckle (intermetacarpal) joint, with repeated sprains. °· Prolonged disability (sometimes). °· Hand and finger stiffness or weakness. °TREATMENT °Treatment first involves ice and medicine to reduce pain and inflammation. An elastic compression bandage may be worn to reduce discomfort and to protect the area. Depending on the severity of injury, you may be required to restrain the area with a cast, splint, or brace. After the ligament has been allowed to heal, strengthening and stretching exercises may be needed to regain strength and a full range of motion. Exercises may be completed at home or with a therapist. Surgery is rarely needed. °MEDICATION  °· If pain medicine is needed, nonsteroidal anti-inflammatory medicines (aspirin and ibuprofen), or other minor pain relievers (acetaminophen), are often advised. °· Do not take pain medicine for 7 days before surgery. °· Stronger pain relievers may be prescribed if your caregiver thinks they are needed. Use only as directed and only as much as you need. °HEAT AND COLD °· Cold treatment (icing) should be applied for 10 to 15 minutes every 2 to 3 hours for inflammation and pain, and immediately after activity that aggravates your symptoms. Use ice packs or an ice massage. °· Heat treatment may be used before performing stretching and strengthening activities prescribed by your caregiver, physical therapist, or athletic trainer. Use a heat pack or a warm water soak. °SEEK MEDICAL CARE IF:  °· Symptoms remain or get worse, despite treatment for longer than 2 to 4 weeks. °· You experience pain, numbness, discoloration, or coldness in the hand or fingers. °·   You develop blue, gray, or dark fingernails. °· Any of the following occur after surgery: increased pain, swelling, redness,  drainage of fluids, bleeding in the affected area, or signs of infection, including fever. °· New, unexplained symptoms develop. (Drugs used in treatment may produce side effects.) °Document Released: 06/29/2005 Document Revised: 11/13/2013 Document Reviewed: 10/11/2008 °[employer]® Patient Information ©2015 [employer]. This information is not intended to replace advice given to you by your health care provider. Make sure you discuss any questions you have with your health care provider. ° °

## 2014-07-27 NOTE — ED Notes (Signed)
Pt presents to department for evaluation of L hand pain, states he was working on car yesterday and part dropped on L hand. Reports increased pain and swelling today. Pt is alert and oriented x4.

## 2014-07-27 NOTE — ED Provider Notes (Signed)
CSN: [id number]     Arrival date & time 07/27/14  1127 History  This chart was scribed for non-physician practitioner, Eben Burowourtney A Forcucci, PA-C, working with Juliet RudeNathan R. Rubin PayorPickering, MD by Charline BillsEssence Howell, ED Scribe. This patient was seen in room TR09C/TR09C and the patient's care was started at 12:21 PM.   Chief Complaint  Patient presents with  . Hand Pain   The history is provided by the patient. No language interpreter was used.   HPI Comments: Antonio Ball is a 59 y.o. male, with a h/o hyperlipidemia, who presents to the Emergency Department complaining of constant L hand pain onset yesterday. Pt states that he was working on a car yesterday when the car's starter dropped on his L hand. Pt describes pain as an aching sensation that is exacerbated with certain movements. He denies associated joint swelling, color changes, warmth. Pt has tried soaking his hand in warm water without relief. Pt is R hand dominant. No known allergies.   PCP: Geraldo PitterBLAND,VEITA J, MD  Past Medical History  Diagnosis Date  . Hyperlipidemia    Past Surgical History  Procedure Laterality Date  . Lipoma excision      Shoulder  . Mass excision  04/20/2012    Procedure: EXCISION MASS;  Surgeon: Shelly Rubensteinouglas A Blackman, MD;  [facility];  Service: General;  Laterality: Left;  excision left scrotal nodule   History reviewed. No pertinent family history. History  Substance Use Topics  . Smoking status: Current Every Day Smoker -- 0.50 packs/day for 20 years    Types: Cigarettes  . Smokeless tobacco: Not on file  . Alcohol Use: Yes     Comment: 1 beer a month    Review of Systems  Musculoskeletal: Positive for arthralgias. Negative for joint swelling.  Skin: Negative for color change.  All other systems reviewed and are negative.  Allergies  Review of patient's allergies indicates no known allergies.  Home Medications   Prior to Admission medications   Medication Sig Start Date End Date Taking? Authorizing  Provider  ibuprofen (ADVIL,MOTRIN) 800 MG tablet Take 1 tablet (800 mg total) by mouth 3 (three) times daily. 07/27/14   Abeer Deskins A Forcucci, PA-C   Triage Vitals: BP 115/72 mmHg  Pulse 79  Temp(Src) 98.1 F (36.7 C) (Oral)  Resp 18  Ht 5\' 7"  (1.702 m)  Wt 181 lb (82.101 kg)  BMI 28.34 kg/m2  SpO2 98% Physical Exam  Constitutional: He is oriented to person, place, and time. He appears well-developed and well-nourished. No distress.  HENT:  Head: Normocephalic and atraumatic.  Eyes: Conjunctivae and EOM are normal.  Neck: Neck supple.  Cardiovascular: Normal rate.   Pulses:      Radial pulses are 2+ on the left side.  Pulmonary/Chest: Effort normal.  Musculoskeletal:       Left wrist: He exhibits decreased range of motion and bony tenderness. He exhibits no tenderness, no swelling, no effusion, no crepitus, no deformity and no laceration.       Left hand: He exhibits decreased range of motion, tenderness and bony tenderness. He exhibits normal two-point discrimination, normal capillary refill, no deformity, no laceration and no swelling. Normal sensation noted. Normal strength noted.  Neurological: He is alert and oriented to person, place, and time.  Skin: Skin is warm and dry.  Psychiatric: He has a normal mood and affect. His behavior is normal.  Nursing note and vitals reviewed.  ED Course  Procedures (including critical care time) DIAGNOSTIC STUDIES: Oxygen Saturation  is 98% on RA, normal by my interpretation.    COORDINATION OF CARE: 12:34 PM-Discussed treatment plan which includes XR with pt at bedside and pt agreed to plan.   Labs Review Labs Reviewed - No data to display  Imaging Review Dg Hand Complete Left  07/27/2014   CLINICAL DATA:  Status post crush injury yesterday now with pain over the dorsum and palm of the left hand.  EXAM: LEFT HAND - COMPLETE 3+ VIEW  COMPARISON:  None.  FINDINGS: The bones of the left hand are adequately mineralized. There is no acute  fracture nor dislocation. There is mild degenerative change of the first carpometacarpal joint. The soft tissues exhibit no foreign bodies.  IMPRESSION: There is no acute fracture nor other acute bony abnormality of the left hand.   Electronically Signed   By: David  [place]   On: 07/27/2014 12:49     EKG Interpretation None      MDM   Final diagnoses:  Hand sprain, left, initial encounter   Patient is a 59 year old male who presents emergency room for evaluation of left hand pain after an accident yesterday. Physical exam reveals a neurovascularly intact left hand. There are no obvious deformities. Thumb x-ray is negative. Suspect that this is hand contusion versus hand sprain. We'll place in a cock-up splint and will have him follow-up with his PCP and 2 weeks if not better. Patient states he has an appointment set up already. Patient to be sent home with ibuprofen 3 times a day and we'll place in a cock-up splint for comfort. Patient to return for symptoms of compartment syndrome. Patient states understanding and agreement at this time.  I personally performed the services described in this documentation, which was scribed in my presence. The recorded information has been reviewed and is accurate.    Eben Burow, PA-C 07/27/14 1316  Nathan R. Rubin Payor, MD 07/30/14 1316

## 2015-08-11 ENCOUNTER — Emergency Department (HOSPITAL_COMMUNITY)
Admission: EM | Admit: 2015-08-11 | Discharge: 2015-08-11 | Disposition: A | Discharge status: Home / Self Care | Payer: Medicaid Other | Attending: Emergency Medicine | Admitting: Emergency Medicine

## 2015-08-11 ENCOUNTER — Encounter (HOSPITAL_COMMUNITY): Payer: Self-pay | Admitting: Emergency Medicine

## 2015-08-11 DIAGNOSIS — F17210 Nicotine dependence, cigarettes, uncomplicated: Secondary | ICD-10-CM | POA: Diagnosis not present

## 2015-08-11 DIAGNOSIS — H9201 Otalgia, right ear: Secondary | ICD-10-CM | POA: Diagnosis present

## 2015-08-11 DIAGNOSIS — Z791 Long term (current) use of non-steroidal anti-inflammatories (NSAID): Secondary | ICD-10-CM | POA: Diagnosis not present

## 2015-08-11 DIAGNOSIS — H6091 Unspecified otitis externa, right ear: Secondary | ICD-10-CM | POA: Diagnosis not present

## 2015-08-11 DIAGNOSIS — Z8639 Personal history of other endocrine, nutritional and metabolic disease: Secondary | ICD-10-CM | POA: Diagnosis not present

## 2015-08-11 DIAGNOSIS — H9191 Unspecified hearing loss, right ear: Secondary | ICD-10-CM | POA: Insufficient documentation

## 2015-08-11 MED ORDER — CIPROFLOXACIN-DEXAMETHASONE 0.3-0.1 % OT SUSP
4.0000 [drp] | Freq: Once | OTIC | Status: AC
  Administered 2015-08-11: 4 [drp] via OTIC
  Filled 2015-08-11: qty 7.5

## 2015-08-11 NOTE — Discharge Instructions (Signed)
Please read and follow all provided instructions.  Your diagnoses today include:  1. Otitis externa of right ear    Tests performed today include:  Vital signs. See below for your results today.   Medications prescribed:   Ciprodex ear drops - instill 4 drops into affected ear twice daily for 7 days  Take any prescribed medications only as directed.  Home care instructions:  Follow any educational materials contained in this packet.  BE VERY CAREFUL not to take multiple medicines containing Tylenol (also called acetaminophen). Doing so can lead to an overdose which can damage your liver and cause liver failure and possibly death.   Follow-up instructions: Please follow-up with your primary care provider in the next 3 days for further evaluation of your symptoms.   Return instructions:   Please return to the Emergency Department if you experience worsening symptoms.   Please return if you have any other emergent concerns.  Additional Information:  Your vital signs today were: BP 130/90 mmHg   Pulse 73   Temp(Src) 97.5 F (36.4 C) (Oral)   Resp 16   Ht  (1.702 m)   Wt 82.555 kg   BMI 28.50 kg/m2   SpO2 100% If your blood pressure (BP) was elevated above 135/85 this visit, please have this repeated by your doctor within one month. --------------

## 2015-08-11 NOTE — ED Provider Notes (Signed)
CSN: [id number]     Arrival date & time 08/11/15  1324 History   First MD Initiated Contact with Patient 08/11/15 0630     Chief Complaint  Patient presents with  . Otalgia     (Consider location/radiation/quality/duration/timing/severity/associated sxs/prior Treatment) HPI Comments: Patient presents with sensation of having water in his right ear with decreased hearing for the past 1 week. Patient denies fevers, recent URI symptoms, trauma to the head or ear. He states that he does not use Q-tips. No drainage from the ear. No treatments prior to arrival other than peroxide. No other medical complaints. No history of diabetes or other immune compromising states.  Patient is a 60 y.o. male presenting with ear pain. The history is provided by the patient.  Otalgia Associated symptoms: hearing loss (Decreased)   Associated symptoms: no abdominal pain, no congestion, no cough, no diarrhea, no ear discharge, no fever, no headaches, no rash, no rhinorrhea, no sore throat and no vomiting     Past Medical History  Diagnosis Date  . Hyperlipidemia    Past Surgical History  Procedure Laterality Date  . Lipoma excision      Shoulder  . Mass excision  04/20/2012    Procedure: EXCISION MASS;  Surgeon: Shelly Rubenstein, MD;  Location: [facility] OR;  Service: General;  Laterality: Left;  excision left scrotal nodule   No family history on file. Social History  Substance Use Topics  . Smoking status: Current Every Day Smoker -- 0.50 packs/day for 20 years    Types: Cigarettes  . Smokeless tobacco: None  . Alcohol Use: Yes     Comment: 1 beer a month    Review of Systems  Constitutional: Negative for fever, chills and fatigue.  HENT: Positive for ear pain and hearing loss (Decreased). Negative for congestion, ear discharge, rhinorrhea, sinus pressure and sore throat.   Eyes: Negative for redness.  Respiratory: Negative for cough and wheezing.   Gastrointestinal: Negative for nausea, vomiting,  abdominal pain and diarrhea.  Genitourinary: Negative for dysuria.  Musculoskeletal: Negative for myalgias and neck stiffness.  Skin: Negative for rash.  Neurological: Negative for headaches.  Hematological: Negative for adenopathy.      Allergies  Review of patient's allergies indicates no known allergies.  Home Medications   Prior to Admission medications   Medication Sig Start Date End Date Taking? Authorizing Provider  ibuprofen (ADVIL,MOTRIN) 800 MG tablet Take 1 tablet (800 mg total) by mouth 3 (three) times daily. 07/27/14   Courtney Forcucci, PA-C   BP 130/90 mmHg  Pulse 73  Temp(Src) 97.5 F (36.4 C) (Oral)  Resp 16  Ht  (1.702 m)  Wt 82.555 kg  BMI 28.50 kg/m2  SpO2 100% Physical Exam  Constitutional: He appears well-developed and well-nourished.  HENT:  Head: Normocephalic and atraumatic.  Right Ear: There is swelling and tenderness (mild). No drainage. No mastoid tenderness. Tympanic membrane is not injected, not scarred, not perforated, not erythematous, not retracted and not bulging. No middle ear effusion. Decreased hearing is noted.  Left Ear: No drainage, swelling or tenderness. No mastoid tenderness. Tympanic membrane is not injected, not scarred, not perforated, not erythematous, not retracted and not bulging.  No middle ear effusion. No decreased hearing is noted.  Nose: No mucosal edema or rhinorrhea.  Edema of right ear canal. No significant drainage or debris. Somewhat difficult to position otoscope speculum 2/2 swelling.   Eyes: Conjunctivae are normal.  Neck: Normal range of motion. Neck supple.  Pulmonary/Chest: No  respiratory distress.  Neurological: He is alert.  Skin: Skin is warm and dry.  Psychiatric: He has a normal mood and affect.  Nursing note and vitals reviewed.   ED Course  Procedures (including critical care time) Labs Review Labs Reviewed - No data to display  Imaging Review No results found. I have personally reviewed  and evaluated these images and lab results as part of my medical decision-making.   EKG Interpretation None      6:39 AM Patient seen and examined. Medications ordered.   Vital signs reviewed and are as follows: BP 130/90 mmHg  Pulse 73  Temp(Src) 97.5 F (36.4 C) (Oral)  Resp 16  Ht  (1.702 m)  Wt 82.555 kg  BMI 28.50 kg/m2  SpO2 100%  Encouraged PCP follow-up if not better in 3-5 days.  MDM   Final diagnoses:  Otitis externa of right ear   Patient with right ear pressure, exam most consistent with otitis externa. TM appears well. No diabetes or signs of malignant otitis.    Renne Crigler, PA-C 08/11/15 1610  Tomasita Crumble, MD 08/11/15 [phone]

## 2015-08-11 NOTE — ED Notes (Signed)
Pain in right ear for about a week.  Reports is worse today.

## 2016-05-15 ENCOUNTER — Encounter (HOSPITAL_COMMUNITY): Payer: Self-pay | Admitting: Vascular Surgery

## 2016-05-15 ENCOUNTER — Emergency Department (HOSPITAL_COMMUNITY)
Admission: EM | Admit: 2016-05-15 | Discharge: 2016-05-15 | Disposition: A | Discharge status: Home / Self Care | Payer: Medicaid Other | Attending: Emergency Medicine | Admitting: Emergency Medicine

## 2016-05-15 ENCOUNTER — Emergency Department (HOSPITAL_COMMUNITY): Payer: Medicaid Other

## 2016-05-15 DIAGNOSIS — Y999 Unspecified external cause status: Secondary | ICD-10-CM | POA: Diagnosis not present

## 2016-05-15 DIAGNOSIS — M65322 Trigger finger, left index finger: Secondary | ICD-10-CM | POA: Diagnosis not present

## 2016-05-15 DIAGNOSIS — Y939 Activity, unspecified: Secondary | ICD-10-CM | POA: Diagnosis not present

## 2016-05-15 DIAGNOSIS — W230XXA Caught, crushed, jammed, or pinched between moving objects, initial encounter: Secondary | ICD-10-CM | POA: Diagnosis not present

## 2016-05-15 DIAGNOSIS — S6992XA Unspecified injury of left wrist, hand and finger(s), initial encounter: Secondary | ICD-10-CM | POA: Diagnosis present

## 2016-05-15 DIAGNOSIS — Y929 Unspecified place or not applicable: Secondary | ICD-10-CM | POA: Insufficient documentation

## 2016-05-15 DIAGNOSIS — F17210 Nicotine dependence, cigarettes, uncomplicated: Secondary | ICD-10-CM | POA: Diagnosis not present

## 2016-05-15 MED ORDER — IBUPROFEN 400 MG PO TABS: 400.0000 mg | ORAL_TABLET | Freq: Three times a day (TID) | ORAL | 0 refills | Status: DC

## 2016-05-15 NOTE — ED Triage Notes (Signed)
Pt reports to the ED for eval of left index finger pain x several weeks. He reports he was chopping wood when he thought he jammed it he placed a bandage around his finger in hopes the pain would resolve but it has not. No open lesion to the finger. CMS intact. Able to move finger but it when he curls it it get stuck.

## 2016-05-15 NOTE — ED Notes (Signed)
Pt verbalized understanding of d/c instructions and has no further questions. Pt stable and NAD. VSS.  

## 2016-05-15 NOTE — Discharge Instructions (Signed)
Wear the splint when possible for the next month Take Ibuprofen 400mg  for pain three times daily Follow up with hand doctor if symptoms are not improving

## 2016-05-15 NOTE — ED Provider Notes (Signed)
[facility] Provider Note   CSN: [id number] Arrival date & time: 05/15/16  2134  By signing my name below, I, Soijett Blue, attest that this documentation has been prepared under the direction and in the presence of Bethel BornKelly Marie Cinthya Bors, PA-C Electronically Signed: Soijett Blue, ED Scribe. 05/15/16. 11:03 PM.   History   Chief Complaint Chief Complaint  Patient presents with  . Finger Injury    HPI  Antonio Ball is a 60 y.o. male who presents to the Emergency Department complaining of left index finger injury occurring 2 weeks ago. Pt notes that his injury could possibley be due to chopping wood and accidentally jamming his left index finger, but voices that he is unsure if this is the true cause of his pain. Pt states that his left index finger gets "stuck" with flexion of his finger and he has to manually move his left index finger back in to extension. Pt is having associated symptoms of left index finger pain and mild redness to the affected area. He notes that he has not tried any medications for the relief of his symptoms. He denies wound, rash, swelling, fever, chills, and any other symptoms.    The history is provided by the patient. No language interpreter was used.    Past Medical History:  Diagnosis Date  . Hyperlipidemia     Patient Active Problem List   Diagnosis Date Noted  . Scrotal nodule 04/12/2012    Past Surgical History:  Procedure Laterality Date  . LIPOMA EXCISION     Shoulder  . MASS EXCISION  04/20/2012   Procedure: EXCISION MASS;  Surgeon: Shelly Rubensteinouglas A Blackman, MD;  Location: [facility] OR;  Service: General;  Laterality: Left;  excision left scrotal nodule       Home Medications    Prior to Admission medications   Medication Sig Start Date End Date Taking? Authorizing Provider  ibuprofen (ADVIL,MOTRIN) 800 MG tablet Take 1 tablet (800 mg total) by mouth 3 (three) times daily. 07/27/14   Terri Piedraourtney Forcucci, PA-C    Family History History  reviewed. No pertinent family history.  Social History Social History  Substance Use Topics  . Smoking status: Current Every Day Smoker    Packs/day: 0.50    Years: 20.00    Types: Cigarettes  . Smokeless tobacco: Never Used  . Alcohol use Yes     Comment: 1 beer a month     Allergies   Review of patient's allergies indicates no known allergies.   Review of Systems Review of Systems  Constitutional: Negative for chills and fever.  Musculoskeletal: Positive for arthralgias (left index finger). Negative for joint swelling.  Skin: Positive for color change (redness to left index finger). Negative for rash and wound.     Physical Exam Updated Vital Signs BP 112/83 (BP Location: Left Arm)   Pulse 81   Temp 98.7 F (37.1 C) (Oral)   Resp 16   Ht 5\' 7"  (1.702 m)   Wt 180 lb (81.6 kg)   SpO2 97%   BMI 28.19 kg/m   Physical Exam  Constitutional: He is oriented to person, place, and time. He appears well-developed and well-nourished. No distress.  HENT:  Head: Normocephalic and atraumatic.  Eyes: EOM are normal.  Neck: Neck supple.  Cardiovascular: Normal rate.   Pulmonary/Chest: Effort normal. No respiratory distress.  Abdominal: He exhibits no distension.  Musculoskeletal:       Left hand: He exhibits decreased range of motion and tenderness.  Mild  tenderness to MCP of left index finger. Decreased ROM. No locking seen on exam, however pt apprehensive to fully flex finger. Mild erythema of MCP extending up to PIP joint.   Neurological: He is alert and oriented to person, place, and time.  Skin: Skin is warm and dry.  Psychiatric: He has a normal mood and affect. His behavior is normal.  Nursing note and vitals reviewed.    ED Treatments / Results  DIAGNOSTIC STUDIES: Oxygen Saturation is 97% on RA, nl by my interpretation.    COORDINATION OF CARE: 10:56 PM Discussed treatment plan with pt at bedside which includes left hand xray and pt agreed to  plan.   Radiology Dg Hand Complete Left  Result Date: 05/15/2016 CLINICAL DATA:  Left second finger pain. EXAM: LEFT HAND - COMPLETE 3+ VIEW COMPARISON:  Hand radiograph 07/27/2014. FINDINGS: There is no evidence of fracture or dislocation. There is no evidence of arthropathy or other focal bone abnormality. Soft tissues are unremarkable. Specifically, no evidence of ankylosis or other joint abnormality of the left second finger. IMPRESSION: No acute fracture or dislocation of the left hand. Electronically Signed   By: Deatra RobinsonKevin  Herman M.D.   On: 05/15/2016 22:53    Procedures Procedures (including critical care time)  Medications Ordered in ED Medications - No data to display   Initial Impression / Assessment and Plan / ED Course  I have reviewed the triage vital signs and the nursing notes.  Pertinent labs & imaging results that were available during my care of the patient were reviewed by me and considered in my medical decision making (see chart for details).  Clinical Course   60 year old male with symptoms consistent with trigger finger. Patient X-Ray negative for obvious fracture or dislocation.  Pt advised to follow up with hand surgery. Patient given finger splint while in ED, conservative therapy recommended and discussed. Patient will be discharged home & is agreeable with above plan. Returns precautions discussed. Pt appears safe for discharge.  Final Clinical Impressions(s) / ED Diagnoses   Final diagnoses:  Trigger index finger of left hand    New Prescriptions New Prescriptions   No medications on file   I personally performed the services described in this documentation, which was scribed in my presence. The recorded information has been reviewed and is accurate.     Bethel BornKelly Marie Siler Mavis, PA-C 05/18/16 1036    Doug SouSam Jacubowitz, MD 05/18/16 1750

## 2016-05-15 NOTE — ED Notes (Signed)
Pt stated he has had difficulties straightening left index finger after flexion x2 weeks. Pt was chopping wood at time of original injury. Cap refill <3 seconds. No bruising or laceration seen on assessment.

## 2016-05-30 ENCOUNTER — Encounter (HOSPITAL_COMMUNITY): Payer: Self-pay | Admitting: Emergency Medicine

## 2016-05-30 ENCOUNTER — Emergency Department (HOSPITAL_COMMUNITY)
Admission: EM | Admit: 2016-05-30 | Discharge: 2016-05-31 | Disposition: A | Discharge status: Home / Self Care | Payer: Medicaid Other | Attending: Emergency Medicine | Admitting: Emergency Medicine

## 2016-05-30 DIAGNOSIS — K0889 Other specified disorders of teeth and supporting structures: Secondary | ICD-10-CM | POA: Insufficient documentation

## 2016-05-30 DIAGNOSIS — F17210 Nicotine dependence, cigarettes, uncomplicated: Secondary | ICD-10-CM | POA: Insufficient documentation

## 2016-05-30 NOTE — ED Triage Notes (Signed)
Pt. reports right lower molar pain onset last week unrelieved by OTC pain medication .

## 2016-05-31 MED ORDER — PENICILLIN V POTASSIUM 500 MG PO TABS: 500.0000 mg | ORAL_TABLET | Freq: Four times a day (QID) | ORAL | 0 refills | Status: AC

## 2016-05-31 MED ORDER — HYDROCODONE-ACETAMINOPHEN 5-325 MG PO TABS
2.0000 | ORAL_TABLET | Freq: Once | ORAL | Status: AC
  Administered 2016-05-31: 2 via ORAL
  Filled 2016-05-31: qty 2

## 2016-05-31 MED ORDER — PENICILLIN V POTASSIUM 250 MG PO TABS
500.0000 mg | ORAL_TABLET | Freq: Once | ORAL | Status: AC
  Administered 2016-05-31: 500 mg via ORAL
  Filled 2016-05-31: qty 2

## 2016-05-31 NOTE — ED Provider Notes (Signed)
[facility] Provider Note   CSN: [id number] Arrival date & time: 05/30/16  2054     History   Chief Complaint Chief Complaint  Patient presents with  . Dental Pain    HPI Antonio RiffleMichael M Ball is a 60 y.o. male.  HPI   Patient is a 60 year old male with a history of hyperlipidemia who presents to the emergency department with progressively worsening right lower dental pain for 3 days. Patient states he's not had pain in this tooth before. Patient has a history of poor dentition. Patient has not tried anything for this pain. Nothing makes it better. Chest, air, cold beverages make the pain worse. No associated symptoms. Patient denies fever, chills, sore throat, difficulty swelling, pain or swelling under his tongue or of his neck.   Past Medical History:  Diagnosis Date  . Hyperlipidemia     Patient Active Problem List   Diagnosis Date Noted  . Scrotal nodule 04/12/2012    Past Surgical History:  Procedure Laterality Date  . LIPOMA EXCISION     Shoulder  . MASS EXCISION  04/20/2012   Procedure: EXCISION MASS;  Surgeon: Shelly Rubensteinouglas A Blackman, MD;  Location: [facility] OR;  Service: General;  Laterality: Left;  excision left scrotal nodule       Home Medications    Prior to Admission medications   Medication Sig Start Date End Date Taking? Authorizing Provider  ibuprofen (ADVIL,MOTRIN) 400 MG tablet Take 1 tablet (400 mg total) by mouth 3 (three) times daily. 05/15/16   Bethel BornKelly Marie Gekas, PA-C  penicillin v potassium (VEETID) 500 MG tablet Take 1 tablet (500 mg total) by mouth 4 (four) times daily. 05/31/16 06/07/16  Jerre SimonJessica L Wilberth Damon, PA    Family History No family history on file.  Social History Social History  Substance Use Topics  . Smoking status: Current Every Day Smoker    Packs/day: 0.50    Years: 20.00    Types: Cigarettes  . Smokeless tobacco: Never Used  . Alcohol use Yes     Comment: 1 beer a month     Allergies   Patient has no known  allergies.   Review of Systems Review of Systems  Constitutional: Negative for chills and fever.  HENT: Positive for dental problem. Negative for facial swelling, sore throat and trouble swallowing.   Respiratory: Negative for cough.      Physical Exam Updated Vital Signs BP 133/93 (BP Location: Right Arm)   Pulse 93   Temp 98.1 F (36.7 C) (Oral)   Resp 18   SpO2 99%   Physical Exam  Constitutional: He appears well-developed and well-nourished. No distress.  HENT:  Head: Normocephalic and atraumatic.  Mouth/Throat: Uvula is midline, oropharynx is clear and moist and mucous membranes are normal. No trismus in the jaw. Abnormal dentition. Dental caries present. No dental abscesses or uvula swelling.  Patient with filling noted to lower right molar with mild surrounding erythema, no edema, no area of fluctuance or drainable abscess. Mild TTP. No facial swelling. No trismus, no sublingual swelling.  Eyes: Conjunctivae are normal.  Pulmonary/Chest: Effort normal. No respiratory distress.  Musculoskeletal: Normal range of motion.  Neurological: He is alert. Coordination normal.  Skin: Skin is warm and dry. He is not diaphoretic.  Psychiatric: He has a normal mood and affect. His behavior is normal.  Nursing note and vitals reviewed.    ED Treatments / Results  Labs (all labs ordered are listed, but only abnormal results are displayed) Labs Reviewed -  No data to display  EKG  EKG Interpretation None       Radiology No results found.  Procedures Procedures (including critical care time)  Medications Ordered in ED Medications  HYDROcodone-acetaminophen (NORCO/VICODIN) 5-325 MG per tablet 2 tablet (2 tablets Oral Given 05/31/16 0058)  penicillin v potassium (VEETID) tablet 500 mg (500 mg Oral Given 05/31/16 0058)     Initial Impression / Assessment and Plan / ED Course  I have reviewed the triage vital signs and the nursing notes.  Pertinent labs & imaging  results that were available during my care of the patient were reviewed by me and considered in my medical decision making (see chart for details).  Clinical Course    Patient with dentalgia.  No abscess requiring immediate incision and drainage.  Exam not concerning for Ludwig's angina or pharyngeal abscess.  Will treat with penicillin. Pt instructed to follow-up with dentist.  Discussed return precautions. Pt safe for discharge.   Final Clinical Impressions(s) / ED Diagnoses   Final diagnoses:  Pain, dental    New Prescriptions Discharge Medication List as of 05/31/2016  1:03 AM    START taking these medications   Details  penicillin v potassium (VEETID) 500 MG tablet Take 1 tablet (500 mg total) by mouth 4 (four) times daily., Starting Sun 05/31/2016, Until Sun 06/07/2016, Print         Jerre SimonJessica L Stephanos Fan, PA 05/31/16 [id number]    Glynn OctaveStephen Rancour, MD 05/31/16 [id number]

## 2016-05-31 NOTE — Discharge Instructions (Signed)
Take the penicillin as prescribed and be sure to complete the entire course. If you develop rash or diarrhea discontinued this antibiotic and return to the emergency department. Follow-up with a dentist on Monday morning to be reevaluated. Return to the emergency department if you experience worsening pain, swelling, difficulty swallowing, difficulty opening your mouth, swelling or pain under your tongue or of your neck, fever or any other concerning symptoms.

## 2018-08-13 IMAGING — CR DG HAND COMPLETE 3+V*L*
3 series · 3 of 3 positions shown · non-contrast
Comparison: Hand radiograph 07/27/2014.

CLINICAL DATA: Left second finger pain.

EXAM:
LEFT HAND - COMPLETE 3+ VIEW

[hand pa]
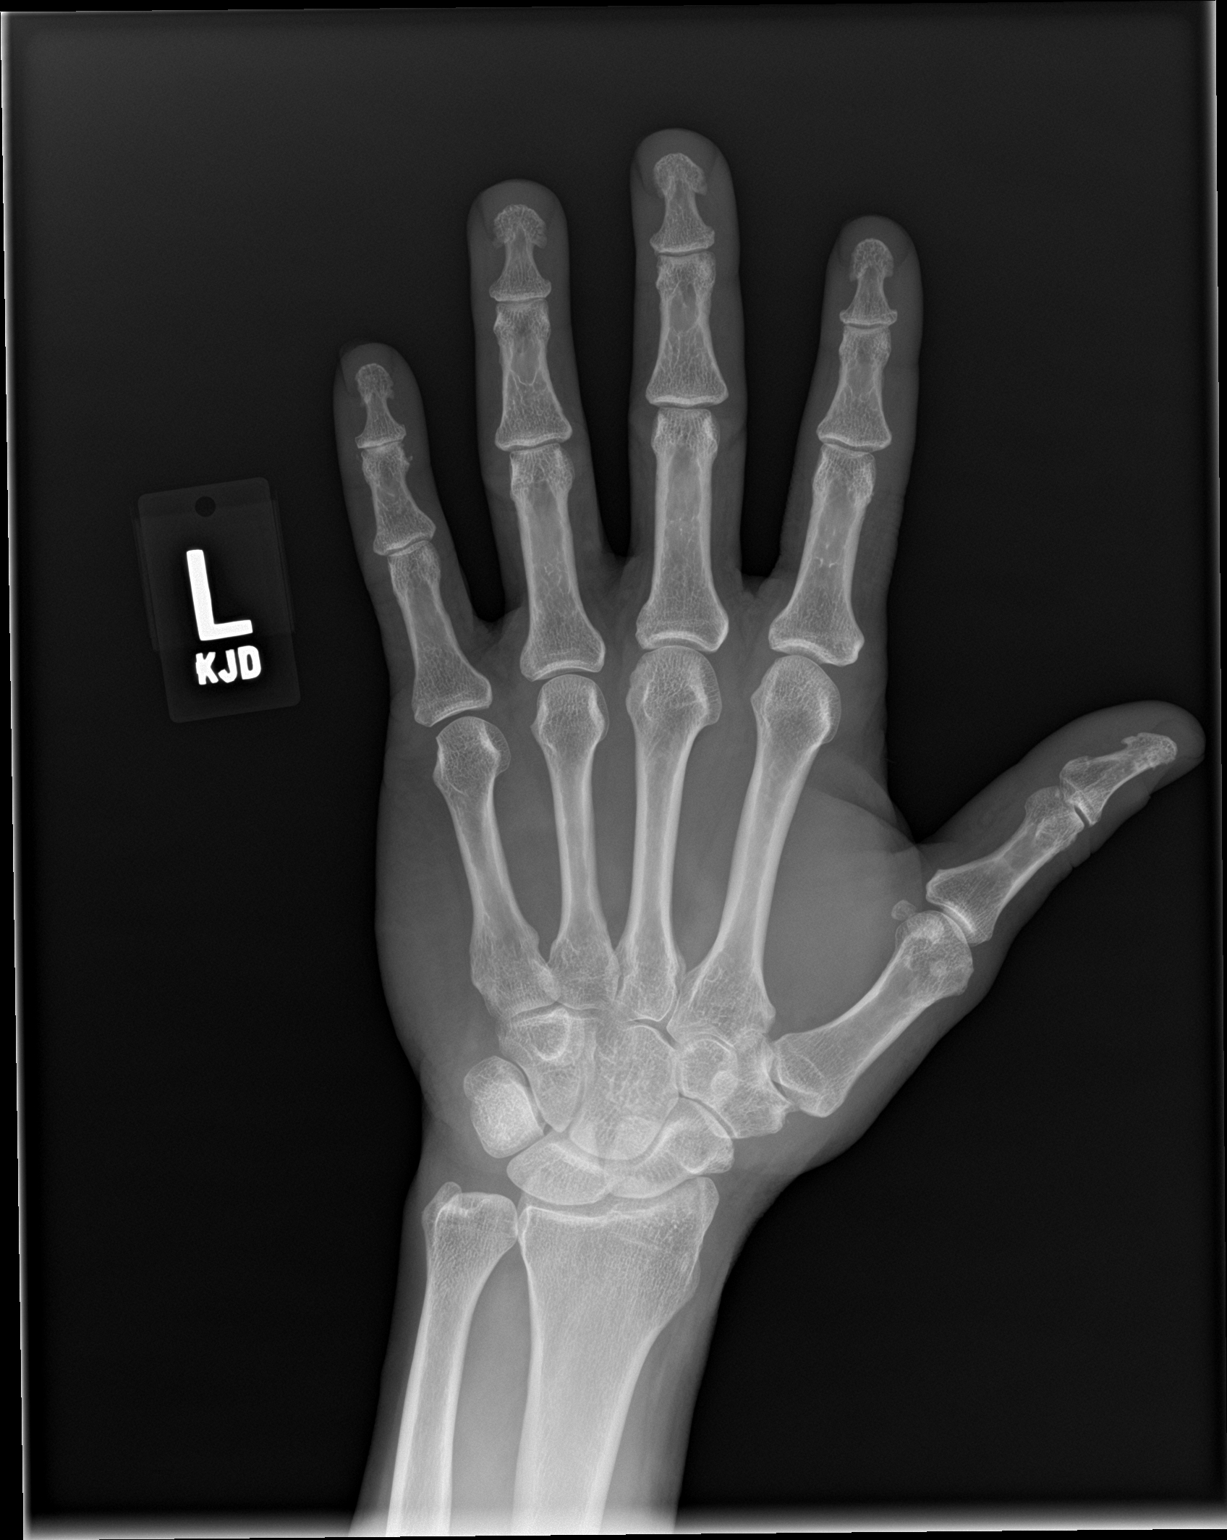

[hand obl]
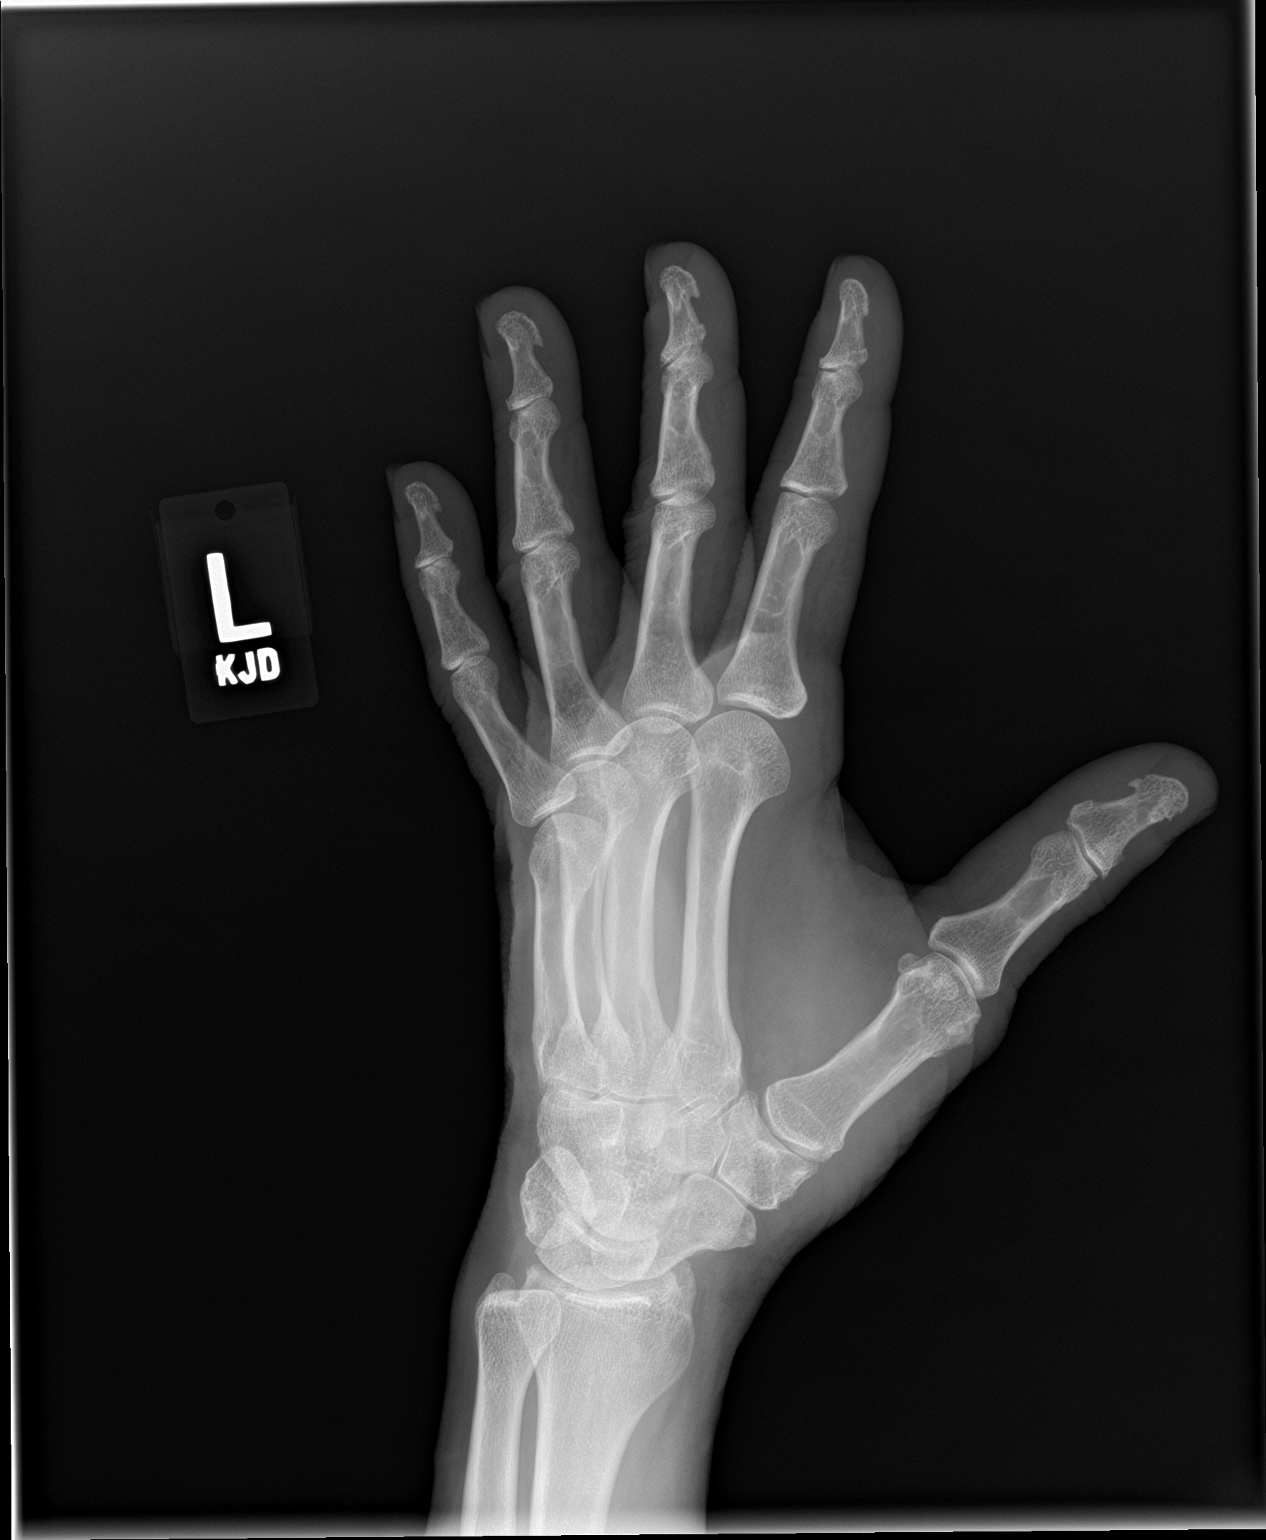

[hand lat]
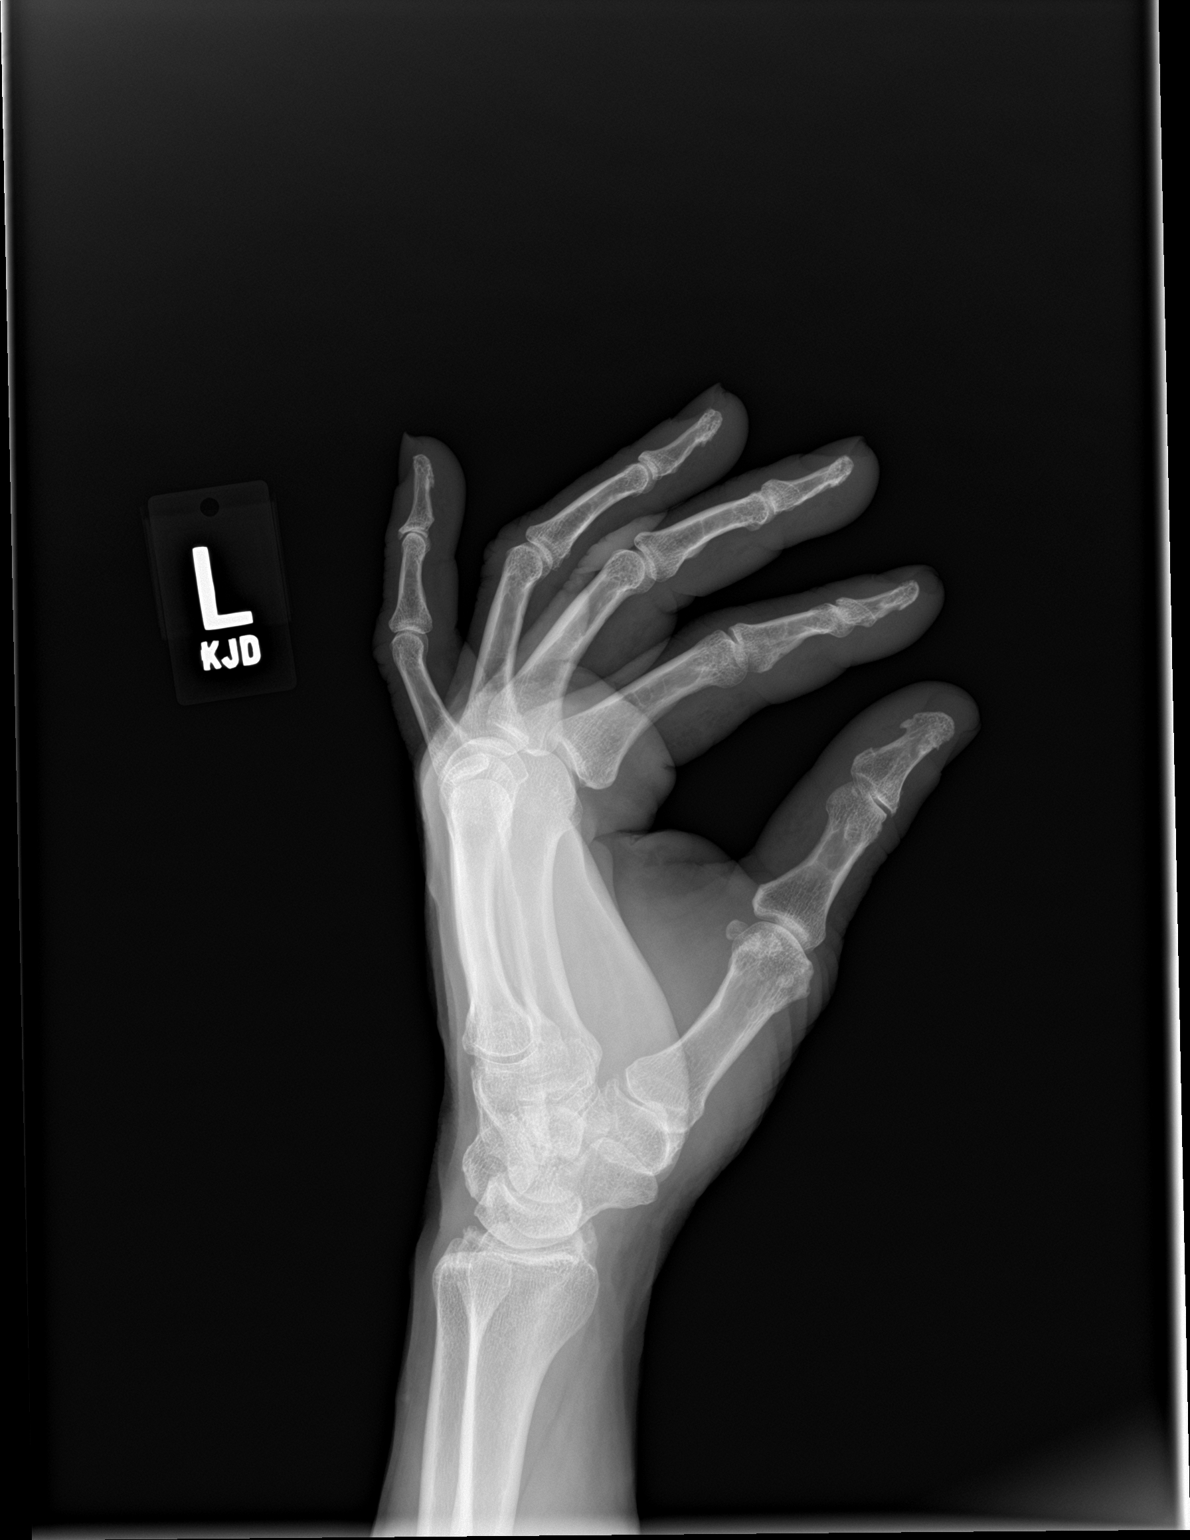

[3 of 3 positions shown; findings below may reference images not displayed]

FINDINGS: There is no evidence of fracture or dislocation. There is no
evidence of arthropathy or other focal bone abnormality. Soft
tissues are unremarkable. Specifically, no evidence of ankylosis or
other joint abnormality of the left second finger.
IMPRESSION: No acute fracture or dislocation of the left hand.

## 2019-10-12 ENCOUNTER — Ambulatory Visit: Payer: Medicaid Other | Attending: Internal Medicine

## 2019-10-12 DIAGNOSIS — Z23 Encounter for immunization: Secondary | ICD-10-CM

## 2019-10-12 NOTE — Progress Notes (Signed)
   Covid-19 Vaccination Clinic  Name:  Antonio Ball    MRN: [id number] DOB: [birth year]  10/12/2019  [name] was observed post Covid-19 immunization for 15 minutes without incident. He was provided with Vaccine Information Sheet and instruction to access the V-Safe system.   [name] was instructed to call 911 with any severe reactions post vaccine: Marland Kitchen Difficulty breathing  . Swelling of face and throat  . A fast heartbeat  . A bad rash all over body  . Dizziness and weakness   Immunizations Administered    Name Date Dose VIS Date Route   Pfizer COVID-19 Vaccine 10/12/2019  2:04 PM 0.3 mL 06/23/2019 Intramuscular   Manufacturer: [employer], [employer]   Lot: [id number]   NDC: [address]-6

## 2019-11-06 ENCOUNTER — Ambulatory Visit: Payer: Medicaid Other | Attending: Internal Medicine

## 2019-11-06 DIAGNOSIS — Z23 Encounter for immunization: Secondary | ICD-10-CM

## 2019-11-06 NOTE — Progress Notes (Signed)
   Covid-19 Vaccination Clinic  Name:  Antonio Ball    MRN: [id number] DOB: 25-Aug-1955  11/06/2019  [name] was observed post Covid-19 immunization for 15 minutes without incident. He was provided with Vaccine Information Sheet and instruction to access the V-Safe system.   [name] was instructed to call 911 with any severe reactions post vaccine: Marland Kitchen Difficulty breathing  . Swelling of face and throat  . A fast heartbeat  . A bad rash all over body  . Dizziness and weakness   Immunizations Administered    Name Date Dose VIS Date Route   Pfizer COVID-19 Vaccine 11/06/2019  2:00 PM 0.3 mL 09/06/2018 Intramuscular   Manufacturer: [employer], [employer]   Lot: [id number]   NDC: [address]-2

## 2021-06-26 ENCOUNTER — Other Ambulatory Visit: Payer: Self-pay

## 2021-06-26 ENCOUNTER — Ambulatory Visit (HOSPITAL_COMMUNITY)
Admission: EM | Admit: 2021-06-26 | Discharge: 2021-06-26 | Disposition: A | Discharge status: Home / Self Care | Payer: Medicaid Other

## 2021-06-26 ENCOUNTER — Encounter (HOSPITAL_COMMUNITY): Payer: Self-pay

## 2021-06-26 DIAGNOSIS — L0231 Cutaneous abscess of buttock: Secondary | ICD-10-CM

## 2021-06-26 MED ORDER — DOXYCYCLINE HYCLATE 100 MG PO CAPS: 100.0000 mg | ORAL_CAPSULE | Freq: Two times a day (BID) | ORAL | 0 refills | Status: DC

## 2021-06-26 MED ORDER — IBUPROFEN 800 MG PO TABS: 800.0000 mg | ORAL_TABLET | Freq: Three times a day (TID) | ORAL | 0 refills | Status: DC

## 2021-06-26 NOTE — ED Triage Notes (Signed)
Pt reports painful  cyst in the right leg x 2-3 weeks.

## 2021-06-26 NOTE — Discharge Instructions (Signed)
Take doxycycline twice a day for the next 7 days  You may use ibuprofen 800 mg 3 times a day as needed for  Hold warm-hot compresses to affected area at least 4 times a day, this helps to facilitate draining, the more the better  Please return for evaluation for increased swelling, increased tenderness or pain, non healing site, non draining site, you begin to have fever or chills   We reviewed the etiology of recurrent abscesses of skin.  Skin abscesses are collections of pus within the dermis and deeper skin tissues. Skin abscesses manifest as painful, tender, fluctuant, and erythematous nodules, frequently surmounted by a pustule and surrounded by a rim of erythematous swelling.  Spontaneous drainage of purulent material may occur.  Fever can occur on occasion.    -Skin abscesses can develop in healthy individuals with no predisposing conditions other than skin or nasal carriage of Staphylococcus aureus.  Individuals in close contact with others who have active infection with skin abscesses are at increased risk which is likely to explain why twin brother has similar episodes.   In addition, any process leading to a breach in the skin barrier can also predispose to the development of a skin abscesses, such as atopic dermatitis.

## 2021-06-26 NOTE — ED Provider Notes (Signed)
[facility]    CSN: [id number] Arrival date & time: 06/26/21  1648      History   Chief Complaint Chief Complaint  Patient presents with   Cyst    HPI Antonio Ball is a 65 y.o. male.   Patient presents with cyst on the right buttocks for 3 weeks.  Endorses that it has increased in size and become more tender.  Painful to sit.  denies drainage, fever, chills.  Has not attempted treatment.  History of hyperlipidemia, scrotal nodule.  Past Medical History:  Diagnosis Date   Hyperlipidemia     Patient Active Problem List   Diagnosis Date Noted   Scrotal nodule 04/12/2012    Past Surgical History:  Procedure Laterality Date   LIPOMA EXCISION     Shoulder   MASS EXCISION  04/20/2012   Procedure: EXCISION MASS;  Surgeon: Shelly Rubenstein, MD;  Location: [facility] OR;  Service: General;  Laterality: Left;  excision left scrotal nodule       Home Medications    Prior to Admission medications   Medication Sig Start Date End Date Taking? Authorizing Provider  doxycycline (VIBRAMYCIN) 100 MG capsule Take 1 capsule (100 mg total) by mouth 2 (two) times daily. 06/26/21  Yes Maricruz Lucero R, NP  ibuprofen (ADVIL) 800 MG tablet Take 1 tablet (800 mg total) by mouth 3 (three) times daily. 06/26/21  Yes Kerryann Allaire R, NP  Pitavastatin Calcium (LIVALO) 4 MG TABS Take by mouth daily.   Yes [provider]    Family History History reviewed. No pertinent family history.  Social History Social History   Tobacco Use   Smoking status: Every Day    Packs/day: 0.50    Years: 20.00    Pack years: 10.00    Types: Cigarettes   Smokeless tobacco: Never  Substance Use Topics   Alcohol use: Yes    Comment: 1 beer a month   Drug use: Yes    Types: Marijuana     Allergies   Patient has no known allergies.   Review of Systems Review of Systems  Constitutional: Negative.   Respiratory: Negative.    Cardiovascular: Negative.   Skin: Negative.    Neurological: Negative.     Physical Exam Triage Vital Signs ED Triage Vitals  Enc Vitals Group     BP 06/26/21 1721 (!) 130/92     Pulse Rate 06/26/21 1721 81     Resp 06/26/21 1721 18     Temp 06/26/21 1721 98.5 F (36.9 C)     Temp Source 06/26/21 1721 Oral     SpO2 06/26/21 1721 96 %     Weight --      Height --      Head Circumference --      Peak Flow --      Pain Score 06/26/21 1719 6     Pain Loc --      Pain Edu? --      Excl. in GC? --    No data found.  Updated Vital Signs BP (!) 130/92 (BP Location: Left Arm)    Pulse 81    Temp 98.5 F (36.9 C) (Oral)    Resp 18    SpO2 96%   Visual Acuity Right Eye Distance:   Left Eye Distance:   Bilateral Distance:    Right Eye Near:   Left Eye Near:    Bilateral Near:     Physical Exam Constitutional:  Appearance: Normal appearance. He is normal weight.  HENT:     Head: Normocephalic.  Eyes:     Extraocular Movements: Extraocular movements intact.  Pulmonary:     Effort: Pulmonary effort is normal.  Skin:    Comments: 3 x 3 absence with present on the lower right buttocks, tender to touch, mild to oderate swelling  Neurological:     Mental Status: He is alert and oriented to person, place, and time. Mental status is at baseline.  Psychiatric:        Mood and Affect: Mood normal.        Behavior: Behavior normal.     UC Treatments / Results  Labs (all labs ordered are listed, but only abnormal results are displayed) Labs Reviewed - No data to display  EKG   Radiology No results found.  Procedures Incision and Drainage  Date/Time: 06/26/2021 5:39 PM Performed by: Valinda Hoar, NP Authorized by: Valinda Hoar, NP   Consent:    Consent obtained:  Verbal   Consent given by:  Patient   Risks, benefits, and alternatives were discussed: yes     Risks discussed:  Incomplete drainage and infection   Alternatives discussed:  Delayed treatment Universal protocol:    Procedure  explained and questions answered to patient or proxy's satisfaction: yes     Patient identity confirmed:  Verbally with patient Location:    Type:  Abscess   Size:  3x3   Location: right buttock. Pre-procedure details:    Skin preparation:  Povidone-iodine Sedation:    Sedation type:  None Anesthesia:    Anesthesia method:  Local infiltration   Local anesthetic:  Lidocaine 1% w/o epi Procedure type:    Complexity:  Simple Procedure details:    Ultrasound guidance: no     Needle aspiration: no     Incision types:  Single straight   Drainage:  Purulent   Drainage amount:  Scant   Wound treatment:  Wound left open   Packing materials:  None Post-procedure details:    Procedure completion:  Tolerated (including critical care time)  Medications Ordered in UC Medications - No data to display  Initial Impression / Assessment and Plan / UC Course  I have reviewed the triage vital signs and the nursing notes.  Pertinent labs & imaging results that were available during my care of the patient were reviewed by me and considered in my medical decision making (see chart for details).  Abscess of right buttocks  1.  Doxycycline 100 mg twice a day for the next 7 days 2.  Ibuprofen 800 mg 3 times daily as needed 3.  Warm compresses to the affected area least 4 times a day 4.  Urgent care follow-up for persistent or nonhealing site. Final Clinical Impressions(s) / UC Diagnoses   Final diagnoses:  Abscess of right buttock     Discharge Instructions      Take doxycycline twice a day for the next 7 days  You may use ibuprofen 800 mg 3 times a day as needed for  Hold warm-hot compresses to affected area at least 4 times a day, this helps to facilitate draining, the more the better  Please return for evaluation for increased swelling, increased tenderness or pain, non healing site, non draining site, you begin to have fever or chills   We reviewed the etiology of recurrent  abscesses of skin.  Skin abscesses are collections of pus within the dermis and deeper skin tissues. Skin abscesses  manifest as painful, tender, fluctuant, and erythematous nodules, frequently surmounted by a pustule and surrounded by a rim of erythematous swelling.  Spontaneous drainage of purulent material may occur.  Fever can occur on occasion.    -Skin abscesses can develop in healthy individuals with no predisposing conditions other than skin or nasal carriage of Staphylococcus aureus.  Individuals in close contact with others who have active infection with skin abscesses are at increased risk which is likely to explain why twin brother has similar episodes.   In addition, any process leading to a breach in the skin barrier can also predispose to the development of a skin abscesses, such as atopic dermatitis.      ED Prescriptions     Medication Sig Dispense Auth. Provider   doxycycline (VIBRAMYCIN) 100 MG capsule Take 1 capsule (100 mg total) by mouth 2 (two) times daily. 14 capsule Kierstynn Babich R, NP   ibuprofen (ADVIL) 800 MG tablet Take 1 tablet (800 mg total) by mouth 3 (three) times daily. 21 tablet Verlia Kaney, Elita Boone, NP      PDMP not reviewed this encounter.   Valinda Hoar, NP 06/26/21 1740

## 2021-07-07 ENCOUNTER — Other Ambulatory Visit: Payer: Self-pay

## 2021-07-07 ENCOUNTER — Encounter (HOSPITAL_COMMUNITY): Payer: Self-pay | Admitting: *Deleted

## 2021-07-07 ENCOUNTER — Emergency Department (HOSPITAL_COMMUNITY)
Admission: EM | Admit: 2021-07-07 | Discharge: 2021-07-08 | Disposition: A | Discharge status: Home / Self Care | Payer: Medicaid Other | Attending: Emergency Medicine | Admitting: Emergency Medicine

## 2021-07-07 DIAGNOSIS — F17210 Nicotine dependence, cigarettes, uncomplicated: Secondary | ICD-10-CM | POA: Diagnosis not present

## 2021-07-07 DIAGNOSIS — L0231 Cutaneous abscess of buttock: Secondary | ICD-10-CM | POA: Diagnosis present

## 2021-07-07 MED ORDER — LIDOCAINE HCL 2 % IJ SOLN
15.0000 mL | Freq: Once | INTRAMUSCULAR | Status: AC
  Administered 2021-07-08: 300 mg
  Filled 2021-07-07: qty 20

## 2021-07-07 NOTE — ED Provider Notes (Signed)
Emergency Medicine Provider Triage Evaluation Note  MALEK SKOG , a 65 y.o. male  was evaluated in triage.  Pt complains of abscess right buttock. Had been draining but stopped, now filling back up with fluid. Pain 5/10 with sitting. Patient has not taken anything for pain. No fever / chills.  Review of Systems  Positive: abscess Negative: Fever, chills  Physical Exam  BP 132/90 (BP Location: Right Arm)    Pulse 88    Temp 98.4 F (36.9 C) (Oral)    Resp 16    SpO2 97%  Gen:   Awake, no distress   Resp:  Normal effort  MSK:   Moves extremities without difficulty  Other:  Around 3cm red tender mass just inferior to right buttock  Medical Decision Making  Medically screening exam initiated at 3:34 PM.  Appropriate orders placed.  RYLEI CODISPOTI was informed that the remainder of the evaluation will be completed by another provider, this initial triage assessment does not replace that evaluation, and the importance of remaining in the ED until their evaluation is complete.  abscess   Olene Floss, PA-C 07/07/21 1536    Melene Plan, DO 07/07/21 [phone]

## 2021-07-07 NOTE — ED Notes (Signed)
Pt NAD sitting on bed in [facility]. A/ox4, c/o "cyst" on right upper thigh x 2 weeks.

## 2021-07-07 NOTE — ED Notes (Signed)
Pt moved to bed 44 for I/D. Provider made aware

## 2021-07-07 NOTE — ED Provider Notes (Signed)
[facility] EMERGENCY DEPARTMENT Provider Note   CSN: [id number] Arrival date & time: 07/07/21  1423     History Chief Complaint  Patient presents with   Abscess    Antonio Ball is a 65 y.o. male.  65 year old male with a history of hyperlipidemia presents to the emergency department for evaluation of abscess.  He reports that he has had the abscess constantly for 2 to 3 weeks.  Went to urgent care and had needle aspiration 10 days ago.  States that the fluid has started to reaccumulate when it was unable to continue draining.  Reports some tenderness, soreness to the area where his abscess is present; worsening.  No associated fevers.   Abscess     Past Medical History:  Diagnosis Date   Hyperlipidemia     Patient Active Problem List   Diagnosis Date Noted   Scrotal nodule 04/12/2012    Past Surgical History:  Procedure Laterality Date   LIPOMA EXCISION     Shoulder   MASS EXCISION  04/20/2012   Procedure: EXCISION MASS;  Surgeon: Shelly Rubenstein, MD;  Location: [facility] OR;  Service: General;  Laterality: Left;  excision left scrotal nodule       History reviewed. No pertinent family history.  Social History   Tobacco Use   Smoking status: Every Day    Packs/day: 0.50    Years: 20.00    Pack years: 10.00    Types: Cigarettes   Smokeless tobacco: Never  Substance Use Topics   Alcohol use: Yes    Comment: 1 beer a month   Drug use: Yes    Types: Marijuana    Home Medications Prior to Admission medications   Medication Sig Start Date End Date Taking? Authorizing Provider  doxycycline (VIBRAMYCIN) 100 MG capsule Take 1 capsule (100 mg total) by mouth 2 (two) times daily. 06/26/21   White, Elita Boone, NP  ibuprofen (ADVIL) 800 MG tablet Take 1 tablet (800 mg total) by mouth 3 (three) times daily. 06/26/21   White, Elita Boone, NP  Pitavastatin Calcium (LIVALO) 4 MG TABS Take by mouth daily.    [provider]    Allergies     Patient has no known allergies.  Review of Systems   Review of Systems Ten systems reviewed and are negative for acute change, except as noted in the HPI.    Physical Exam Updated Vital Signs BP (!) 108/95 (BP Location: Left Arm)    Pulse 84    Temp 98.4 F (36.9 C) (Oral)    Resp 16    SpO2 94%   Physical Exam Vitals and nursing note reviewed.  Constitutional:      General: He is not in acute distress.    Appearance: He is well-developed. He is not diaphoretic.     Comments: Nontoxic appearing and in NAD  HENT:     Head: Normocephalic and atraumatic.  Eyes:     General: No scleral icterus.    Conjunctiva/sclera: Conjunctivae normal.  Pulmonary:     Effort: Pulmonary effort is normal. No respiratory distress.     Comments: Respirations even and unlabored Musculoskeletal:        General: Normal range of motion.     Cervical back: Normal range of motion.  Skin:    General: Skin is warm and dry.     Coloration: Skin is not pale.     Findings: No erythema or rash.     Comments: ~3cm erythematous,  tender mass just inferior to right buttock  Neurological:     Mental Status: He is alert and oriented to person, place, and time.     Coordination: Coordination normal.  Psychiatric:        Behavior: Behavior normal.    ED Results / Procedures / Treatments   Labs (all labs ordered are listed, but only abnormal results are displayed) Labs Reviewed - No data to display  EKG None  Radiology No results found.  Procedures Procedures   INCISION AND DRAINAGE Performed by: Antony Madura Consent: Verbal consent obtained. Risks and benefits: risks, benefits and alternatives were discussed Type: abscess  Body area: R gluteal  Anesthesia: local infiltration  Incision was made with a scalpel.  Local anesthetic: lidocaine 2% without epinephrine  Anesthetic total: 3 ml  Complexity: complex Blunt dissection to break up loculations  Drainage: purulent, bloody  Drainage  amount: moderate  Packing material: none  Patient tolerance: Patient tolerated the procedure well with no immediate complications.    Medications Ordered in ED Medications  lidocaine (XYLOCAINE) 2 % (with pres) injection 300 mg (has no administration in time range)    ED Course  I have reviewed the triage vital signs and the nursing notes.  Pertinent labs & imaging results that were available during my care of the patient were reviewed by me and considered in my medical decision making (see chart for details).    MDM Rules/Calculators/A&P                          Patient with skin abscess amenable to incision and drainage.  Abscess was not large enough to warrant packing or drain; wound recheck in 2 days. Encouraged home warm soaks and flushing.  Mild signs of cellulitis is surrounding skin. No antibiotic therapy indicated. Return precautions discussed and provided. Patient discharged in stable condition with no unaddressed concerns.     Final Clinical Impression(s) / ED Diagnoses Final diagnoses:  Abscess, gluteal, right    Rx / DC Orders ED Discharge Orders     None        Antony Madura, PA-C 07/08/21 0004    Dione Booze, MD 07/08/21 [id number]

## 2021-07-07 NOTE — ED Notes (Signed)
Wife Othel Hoogendoorn [phone] would like an update immediately

## 2021-07-07 NOTE — ED Triage Notes (Signed)
Pt reports having abscess on right buttock that has stopped draining and now has pain when sitting. Denies fever.

## 2021-07-08 NOTE — Discharge Instructions (Signed)
Apply warm compresses at least 3 times a day to promote further drainage. Keep the area covered with a bandage. Change this at least once per day to keep the area clean and dry. We recommend tylenol or ibuprofen for pain. Follow-up with your pediatrician for wound recheck in 48 hours. You may return to the emergency department for new or concerning symptoms.

## 2021-07-08 NOTE — ED Notes (Signed)
Pt NAD, a/ox4. Pt verbalizes understanding of all DC and f/u instructions. All questions answered. Pt walks with steady gait to lobby at DC.  ? ?

## 2021-09-11 ENCOUNTER — Ambulatory Visit: Payer: Self-pay

## 2021-09-11 ENCOUNTER — Other Ambulatory Visit: Payer: Self-pay

## 2021-09-11 ENCOUNTER — Encounter: Payer: Self-pay | Admitting: Surgery

## 2021-09-11 ENCOUNTER — Ambulatory Visit (INDEPENDENT_AMBULATORY_CARE_PROVIDER_SITE_OTHER): Payer: Medicaid Other | Admitting: Surgery

## 2021-09-11 VITALS — BP 131/89 | HR 83 | Ht 67.0 in | Wt 180.0 lb

## 2021-09-11 DIAGNOSIS — M25512 Pain in left shoulder: Secondary | ICD-10-CM | POA: Diagnosis not present

## 2021-09-11 DIAGNOSIS — M4722 Other spondylosis with radiculopathy, cervical region: Secondary | ICD-10-CM | POA: Diagnosis not present

## 2021-09-11 MED ORDER — METHYLPREDNISOLONE 4 MG PO TABS: ORAL_TABLET | ORAL | 0 refills | Status: DC
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
## 2021-09-16 ENCOUNTER — Telehealth: Payer: Self-pay | Admitting: Surgery

## 2021-09-16 NOTE — Telephone Encounter (Signed)
Received vm from [facility] requesting last ov note. I faxed [phone] ?

## 2021-10-14 ENCOUNTER — Other Ambulatory Visit: Payer: Self-pay | Admitting: Family Medicine

## 2021-10-14 ENCOUNTER — Ambulatory Visit
Admission: RE | Admit: 2021-10-14 | Discharge: 2021-10-14 | Disposition: A | Discharge status: Home / Self Care | Payer: Medicaid Other | Source: Ambulatory Visit | Attending: Family Medicine | Admitting: Family Medicine

## 2021-10-14 DIAGNOSIS — M79644 Pain in right finger(s): Secondary | ICD-10-CM

## 2021-10-22 ENCOUNTER — Encounter: Payer: Self-pay | Admitting: Orthopaedic Surgery

## 2021-10-22 ENCOUNTER — Ambulatory Visit (INDEPENDENT_AMBULATORY_CARE_PROVIDER_SITE_OTHER): Payer: Medicaid Other | Admitting: Orthopaedic Surgery

## 2021-10-22 DIAGNOSIS — M25512 Pain in left shoulder: Secondary | ICD-10-CM | POA: Diagnosis not present

## 2021-10-22 NOTE — Progress Notes (Signed)
? ?Office Visit Note ?  ?Patient: Antonio Ball           ?Date of Birth: 08-27-1955           ?MRN: [id number] ?Visit Date: 10/22/2021 ?             ?Requested by: Renaye Rakers, MD ?[address] ?[address] ?[place],  [place] [address] ?PCP: Renaye Rakers, MD ? ? ?Assessment & Plan: ?Visit Diagnoses:  ?1. Left shoulder pain, unspecified chronicity   ? ? ?Plan: Patient's symptoms has resolved.  He will return if he has recurrence of symptoms. ? ?Follow-Up Instructions: No follow-ups on file.  ? ?Orders:  ?No orders of the defined types were placed in this encounter. ? ?No orders of the defined types were placed in this encounter. ? ? ? ? Procedures: ?No procedures performed ? ? ?Clinical Data: ?No additional findings. ? ? ?Subjective: ?Chief Complaint  ?Patient presents with  ? Neck - Follow-up  ? Left Shoulder - Follow-up  ? ? ?HPI 66 year old male returns he has taken the prednisone taper and also had some chiropractic treatments and states that his neck pain and shoulder pain on the left is completely resolved.  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  No gait problems no numbness or tingling in his hands he is sleeping well. ? ?Review of Systems all other systems are noncontributory to HPI. ? ? ?Objective: ?Vital Signs: BP (!) 147/93   Pulse 86   Ht 5\' 7"  (1.702 m)   Wt 181 lb (82.1 kg)   BMI 28.35 kg/m?  ? ?Physical Exam ?Constitutional:   ?   Appearance: He is well-developed.  ?HENT:  ?   Head: Normocephalic and atraumatic.  ?   Right Ear: External ear normal.  ?   Left Ear: External ear normal.  ?Eyes:  ?   Pupils: Pupils are equal, round, and reactive to light.  ?Neck:  ?   Thyroid: No thyromegaly.  ?   Trachea: No tracheal deviation.  ?Cardiovascular:  ?   Rate and Rhythm: Normal rate.  ?Pulmonary:  ?   Effort: Pulmonary effort is normal.  ?   Breath sounds: No wheezing.  ?Abdominal:  ?   General: Bowel sounds are normal.  ?   Palpations: Abdomen is  soft.  ?Musculoskeletal:  ?   Cervical back: Neck supple.  ?Skin: ?   General: Skin is warm and dry.  ?   Capillary Refill: Capillary refill takes less than 2 seconds.  ?Neurological:  ?   Mental Status: He is alert and oriented to person, place, and time.  ?Psychiatric:     ?   Behavior: Behavior normal.     ?   Thought Content: Thought content normal.     ?   Judgment: Judgment normal.  ? ? ?Ortho Exam intact upper extremity reflexes no brachial plexus tenderness negative Spurling negative Neer Long head of the biceps is normal.  No atrophy.  Good motor strength biceps triceps deltoid wrist flexion extension interossei. ? ?Specialty Comments:  ?No specialty comments available. ? ?Imaging: ?No results found. ? ? ?PMFS History: ?Patient Active Problem List  ? Diagnosis Date Noted  ? Pain in left shoulder 10/22/2021  ? Scrotal nodule 04/12/2012  ? ?Past Medical History:  ?Diagnosis Date  ? Hyperlipidemia   ?  ?No family history on file.  ?Past Surgical History:  ?Procedure Laterality Date  ? LIPOMA EXCISION    ?  Shoulder  ? MASS EXCISION  04/20/2012  ? Procedure: EXCISION MASS;  Surgeon: Shelly Rubenstein, MD;  Location: [facility] OR;  Service: General;  Laterality: Left;  excision left scrotal nodule  ? ?Social History  ? ?Occupational History  ? Not on file  ?Tobacco Use  ? Smoking status: Every Day  ?  Packs/day: 0.50  ?  Years: 20.00  ?  Pack years: 10.00  ?  Types: Cigarettes  ? Smokeless tobacco: Never  ?Substance and Sexual Activity  ? Alcohol use: Yes  ?  Comment: 1 beer a month  ? Drug use: Yes  ?  Types: Marijuana  ? Sexual activity: Not on file  ? ? ? ? ? ? ?

## 2022-06-27 ENCOUNTER — Encounter (HOSPITAL_COMMUNITY): Payer: Self-pay

## 2022-06-27 ENCOUNTER — Ambulatory Visit (HOSPITAL_COMMUNITY)
Admission: EM | Admit: 2022-06-27 | Discharge: 2022-06-27 | Disposition: A | Discharge status: Home / Self Care | Payer: Medicaid Other | Attending: Family Medicine | Admitting: Family Medicine

## 2022-06-27 DIAGNOSIS — H60501 Unspecified acute noninfective otitis externa, right ear: Secondary | ICD-10-CM

## 2022-06-27 DIAGNOSIS — K0889 Other specified disorders of teeth and supporting structures: Secondary | ICD-10-CM | POA: Diagnosis not present

## 2022-06-27 MED ORDER — NEOMYCIN-POLYMYXIN-HC 3.5-10000-1 OT SUSP: 4.0000 [drp] | Freq: Three times a day (TID) | OTIC | 0 refills | Status: AC

## 2022-06-27 MED ORDER — AMOXICILLIN 875 MG PO TABS: 875.0000 mg | ORAL_TABLET | Freq: Two times a day (BID) | ORAL | 0 refills | Status: AC

## 2022-06-27 MED ORDER — IBUPROFEN 800 MG PO TABS: 800.0000 mg | ORAL_TABLET | Freq: Three times a day (TID) | ORAL | 0 refills | Status: AC

## 2022-06-27 NOTE — ED Triage Notes (Signed)
Patient with c/o left lower dental pain and itching in right ear for the past 4 days.

## 2022-06-29 NOTE — ED Provider Notes (Signed)
[facility]   [id number] 06/27/22 Arrival Time: 1332  ASSESSMENT & PLAN:  1. Pain, dental   2. Acute otitis externa of right ear, unspecified type    No sign of abscess requiring I&D at this time. No OM. Discussed.  Meds ordered this encounter  Medications   amoxicillin (AMOXIL) 875 MG tablet    Sig: Take 1 tablet (875 mg total) by mouth 2 (two) times daily for 10 days.    Dispense:  20 tablet    Refill:  0   neomycin-polymyxin-hydrocortisone (CORTISPORIN) 3.5-10000-1 OTIC suspension    Sig: Place 4 drops into the right ear 3 (three) times daily for 7 days.    Dispense:  10 mL    Refill:  0   ibuprofen (ADVIL) 800 MG tablet    Sig: Take 1 tablet (800 mg total) by mouth 3 (three) times daily with meals.    Dispense:  21 tablet    Refill:  0    Follow-up Information     Renaye Rakers, MD.   Specialty: Family Medicine Why: If worsening or failing to improve as anticipated. Contact information: [address] [place] [address] [phone]                 Reviewed expectations re: course of current medical issues. Questions answered. Outlined signs and symptoms indicating need for more acute intervention. Patient verbalized understanding. After Visit Summary given.   SUBJECTIVE:  Antonio Ball is a 66 y.o. male who reports left lower dental pain and itching in right ear for the past 4 days. Afebrile. No ear drainage or bleeding. Tolerating PO intake. No tx PTA.  OBJECTIVE: Vitals:   06/27/22 1608  BP: (!) 124/104  Pulse: 85  Resp: 18  Temp: 98.2 F (36.8 C)  TempSrc: Oral  SpO2: 98%    General appearance: alert; no distress HENT: normocephalic; atraumatic; dentition: fair; left lower rear gum without areas of fluctuance, drainage, or bleeding and with tenderness to palpation; normal jaw movement without difficulty; R EAC with dry and cracked skin and mild to moderate erythema; TM looks normal Neck: supple without LAD; FROM;  trachea midline Lungs: normal respirations; unlabored; speaks full sentences without difficulty Skin: warm and dry Psychological: alert and cooperative; normal mood and affect  No Known Allergies  Past Medical History:  Diagnosis Date   Hyperlipidemia    Social History   Socioeconomic History   Marital status: Married    Spouse name: Not on file   Number of children: Not on file   Years of education: Not on file   Highest education level: Not on file  Occupational History   Not on file  Tobacco Use   Smoking status: Every Day    Packs/day: 0.50    Years: 20.00    Total pack years: 10.00    Types: Cigarettes   Smokeless tobacco: Never  Substance and Sexual Activity   Alcohol use: Yes    Comment: 1 beer a month   Drug use: Yes    Types: Marijuana   Sexual activity: Not on file  Other Topics Concern   Not on file  Social History Narrative   Not on file   Social Determinants of Health   Financial Resource Strain: Not on file  Food Insecurity: Not on file  Transportation Needs: Not on file  Physical Activity: Not on file  Stress: Not on file  Social Connections: Not on file  Intimate Partner Violence: Not on  file   History reviewed. No pertinent family history. Past Surgical History:  Procedure Laterality Date   LIPOMA EXCISION     Shoulder   MASS EXCISION  04/20/2012   Procedure: EXCISION MASS;  Surgeon: Shelly Rubenstein, MD;  Location: [facility] OR;  Service: General;  Laterality: Left;  excision left scrotal nodule      Mardella Layman, MD 06/29/22 1348

## 2023-03-08 ENCOUNTER — Ambulatory Visit (HOSPITAL_COMMUNITY)
Admission: EM | Admit: 2023-03-08 | Discharge: 2023-03-08 | Disposition: A | Discharge status: Home / Self Care | Payer: MEDICAID

## 2023-03-08 ENCOUNTER — Encounter (HOSPITAL_COMMUNITY): Payer: Self-pay | Admitting: *Deleted

## 2023-03-08 DIAGNOSIS — S025XXB Fracture of tooth (traumatic), initial encounter for open fracture: Secondary | ICD-10-CM

## 2023-03-08 DIAGNOSIS — K047 Periapical abscess without sinus: Secondary | ICD-10-CM | POA: Diagnosis not present

## 2023-03-08 MED ORDER — TRAMADOL HCL 50 MG PO TABS: 50.0000 mg | ORAL_TABLET | Freq: Four times a day (QID) | ORAL | 0 refills | Status: DC | PRN

## 2023-03-08 MED ORDER — AMOXICILLIN 500 MG PO CAPS: 500.0000 mg | ORAL_CAPSULE | Freq: Three times a day (TID) | ORAL | 0 refills | Status: AC

## 2023-03-08 NOTE — ED Triage Notes (Signed)
Pt states he broke a tooth on the right upper side of his mouth about 3 days ago and now its causing some pain. He has taken ASA without relief.

## 2023-03-08 NOTE — Discharge Instructions (Addendum)
Start amoxicillin 500 mg 3 times daily for 7 days.  May use tramadol 1 tablet every 6 hours as needed for severe pain but may use ibuprofen 600 mg 3 times daily as needed for moderate pain.  Make sure to follow-up with your dentist this week.  Return to urgent care if your symptoms worsen or fail to improve

## 2023-03-08 NOTE — ED Provider Notes (Signed)
[facility]    CSN: [id number] Arrival date & time: 03/08/23  1749      History   Chief Complaint Chief Complaint  Patient presents with   Dental Pain    HPI Antonio Ball is a 67 y.o. male.   67 year old male who presents to urgent care with complaints of tooth pain.  He reports that on Saturday he bit into a knot and broke a tooth on his upper right side.  Initially it was not hurting but did start hurting today.  He has been using peroxide to rinse and brush his teeth.  He denies fevers or chills but has noted that there is some swelling around his gum now.  He has a Education officer, community but has not seen them in a while and plans to call them tomorrow.  He denies fevers or chills.      Dental Pain Associated symptoms: no fever     Past Medical History:  Diagnosis Date   Hyperlipidemia     Patient Active Problem List   Diagnosis Date Noted   Pain in left shoulder 10/22/2021   Scrotal nodule 04/12/2012    Past Surgical History:  Procedure Laterality Date   LIPOMA EXCISION     Shoulder   MASS EXCISION  04/20/2012   Procedure: EXCISION MASS;  Surgeon: Shelly Rubenstein, MD;  Location: [facility];  Service: General;  Laterality: Left;  excision left scrotal nodule       Home Medications    Prior to Admission medications   Medication Sig Start Date End Date Taking? Authorizing Provider  gemfibrozil (LOPID) 600 MG tablet Take 600 mg by mouth 2 (two) times daily. 12/22/22  Yes [provider]  Pitavastatin Calcium (LIVALO) 4 MG TABS Take by mouth daily.   Yes [provider]  ibuprofen (ADVIL) 800 MG tablet Take 1 tablet (800 mg total) by mouth 3 (three) times daily with meals. 06/27/22   Mardella Layman, MD    Family History History reviewed. No pertinent family history.  Social History Social History   Tobacco Use   Smoking status: Every Day    Current packs/day: 0.50    Average packs/day: 0.5 packs/day for 20.0 years (10.0 ttl pk-yrs)     Types: Cigarettes   Smokeless tobacco: Never  Substance Use Topics   Alcohol use: Yes    Comment: 1 beer a month   Drug use: Yes    Types: Marijuana     Allergies   Patient has no known allergies.   Review of Systems Review of Systems  Constitutional:  Negative for chills and fever.  HENT:  Positive for dental problem. Negative for ear pain and sore throat.   Eyes:  Negative for pain and visual disturbance.  Respiratory:  Negative for cough and shortness of breath.   Cardiovascular:  Negative for chest pain and palpitations.  Gastrointestinal:  Negative for abdominal pain and vomiting.  Genitourinary:  Negative for dysuria and hematuria.  Musculoskeletal:  Negative for arthralgias and back pain.  Skin:  Negative for color change and rash.  Neurological:  Negative for seizures and syncope.  All other systems reviewed and are negative.    Physical Exam Triage Vital Signs ED Triage Vitals  Encounter Vitals Group     BP 03/08/23 1931 (!) 136/95     Systolic BP Percentile --      Diastolic BP Percentile --      Pulse Rate 03/08/23 1931 88     Resp 03/08/23  1931 18     Temp 03/08/23 1931 98.1 F (36.7 C)     Temp Source 03/08/23 1931 Oral     SpO2 03/08/23 1931 95 %     Weight --      Height --      Head Circumference --      Peak Flow --      Pain Score 03/08/23 1930 10     Pain Loc --      Pain Education --      Exclude from Growth Chart --    No data found.  Updated Vital Signs BP (!) 136/95 (BP Location: Right Arm)   Pulse 88   Temp 98.1 F (36.7 C) (Oral)   Resp 18   SpO2 95%   Visual Acuity Right Eye Distance:   Left Eye Distance:   Bilateral Distance:    Right Eye Near:   Left Eye Near:    Bilateral Near:     Physical Exam Vitals and nursing note reviewed.  Constitutional:      General: He is not in acute distress.    Appearance: He is well-developed.  HENT:     Head: Normocephalic and atraumatic.     Mouth/Throat:   Eyes:      Conjunctiva/sclera: Conjunctivae normal.  Cardiovascular:     Rate and Rhythm: Normal rate and regular rhythm.     Heart sounds: No murmur heard. Pulmonary:     Effort: Pulmonary effort is normal. No respiratory distress.     Breath sounds: Normal breath sounds.  Abdominal:     Palpations: Abdomen is soft.     Tenderness: There is no abdominal tenderness.  Musculoskeletal:        General: No swelling.     Cervical back: Neck supple.  Skin:    General: Skin is warm and dry.     Capillary Refill: Capillary refill takes less than 2 seconds.  Neurological:     Mental Status: He is alert.  Psychiatric:        Mood and Affect: Mood normal.      UC Treatments / Results  Labs (all labs ordered are listed, but only abnormal results are displayed) Labs Reviewed - No data to display  EKG   Radiology No results found.  Procedures Procedures (including critical care time)  Medications Ordered in UC Medications - No data to display  Initial Impression / Assessment and Plan / UC Course  I have reviewed the triage vital signs and the nursing notes.  Pertinent labs & imaging results that were available during my care of the patient were reviewed by me and considered in my medical decision making (see chart for details).    Open fracture of tooth, initial encounter  Dental infection  Start amoxicillin 500 mg 3 times daily for 7 days.  May use tramadol 1 tablet every 6 hours as needed for severe pain but may use ibuprofen 600 mg 3 times daily as needed for moderate pain.  Make sure to follow-up with dentist this week.  Return to urgent care if your symptoms worsen or fail to improve Final Clinical Impressions(s) / UC Diagnoses   Final diagnoses:  None   Discharge Instructions   None    ED Prescriptions   None    PDMP not reviewed this encounter.   Quintella Reichert 03/08/23 2013

## 2023-06-22 DIAGNOSIS — Z6829 Body mass index (BMI) 29.0-29.9, adult: Secondary | ICD-10-CM | POA: Diagnosis not present

## 2023-06-22 DIAGNOSIS — R972 Elevated prostate specific antigen [PSA]: Secondary | ICD-10-CM | POA: Diagnosis not present

## 2023-06-22 DIAGNOSIS — R21 Rash and other nonspecific skin eruption: Secondary | ICD-10-CM | POA: Diagnosis not present

## 2023-06-22 DIAGNOSIS — E7800 Pure hypercholesterolemia, unspecified: Secondary | ICD-10-CM | POA: Diagnosis not present

## 2023-09-20 DIAGNOSIS — R972 Elevated prostate specific antigen [PSA]: Secondary | ICD-10-CM | POA: Diagnosis not present

## 2023-09-20 DIAGNOSIS — E7800 Pure hypercholesterolemia, unspecified: Secondary | ICD-10-CM | POA: Diagnosis not present

## 2023-12-21 DIAGNOSIS — R972 Elevated prostate specific antigen [PSA]: Secondary | ICD-10-CM | POA: Diagnosis not present

## 2023-12-21 DIAGNOSIS — E7800 Pure hypercholesterolemia, unspecified: Secondary | ICD-10-CM | POA: Diagnosis not present

## 2023-12-21 DIAGNOSIS — Z6829 Body mass index (BMI) 29.0-29.9, adult: Secondary | ICD-10-CM | POA: Diagnosis not present

## 2024-01-12 IMAGING — CR DG FINGER THUMB 2+V*R*
3 series · 3 of 3 positions shown · non-contrast
Comparison: None.

CLINICAL DATA: Right thumb pain. Clinical concern for
osteomyelitis.

EXAM:
RIGHT THUMB 2+V

[x finger obl. right]
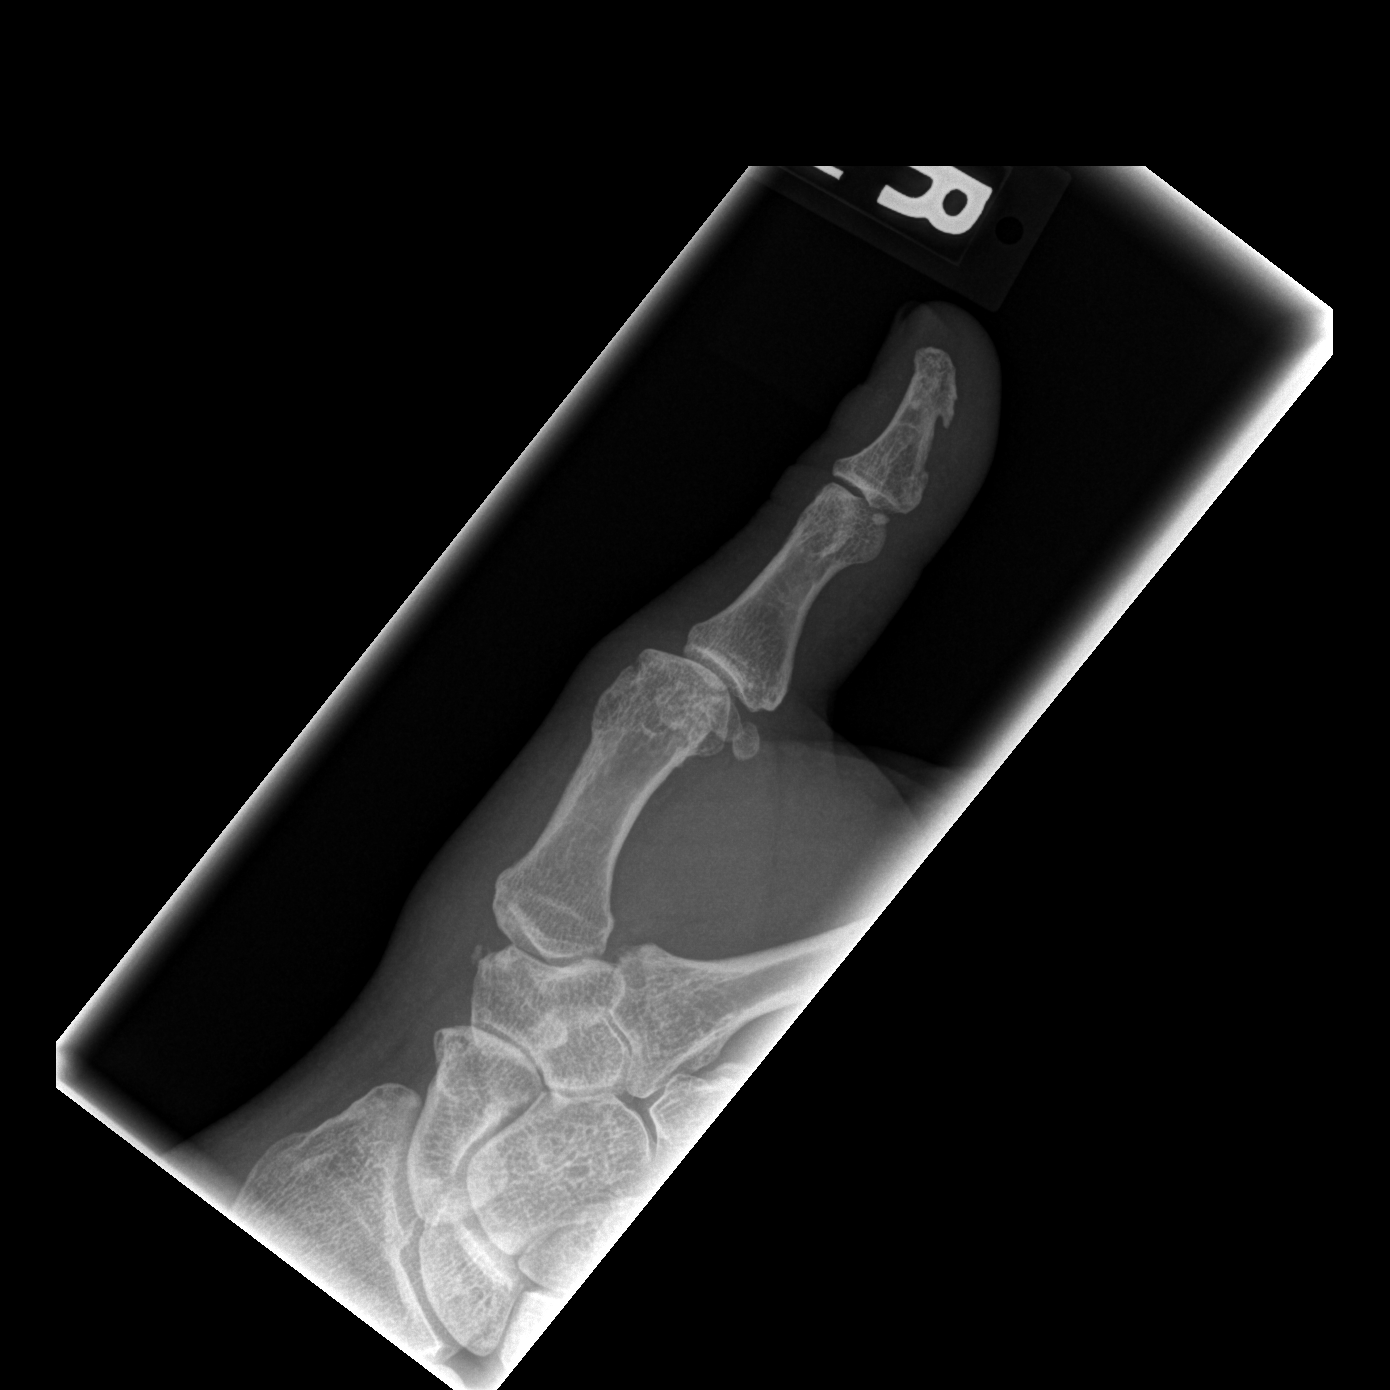

[x finger lateral right]
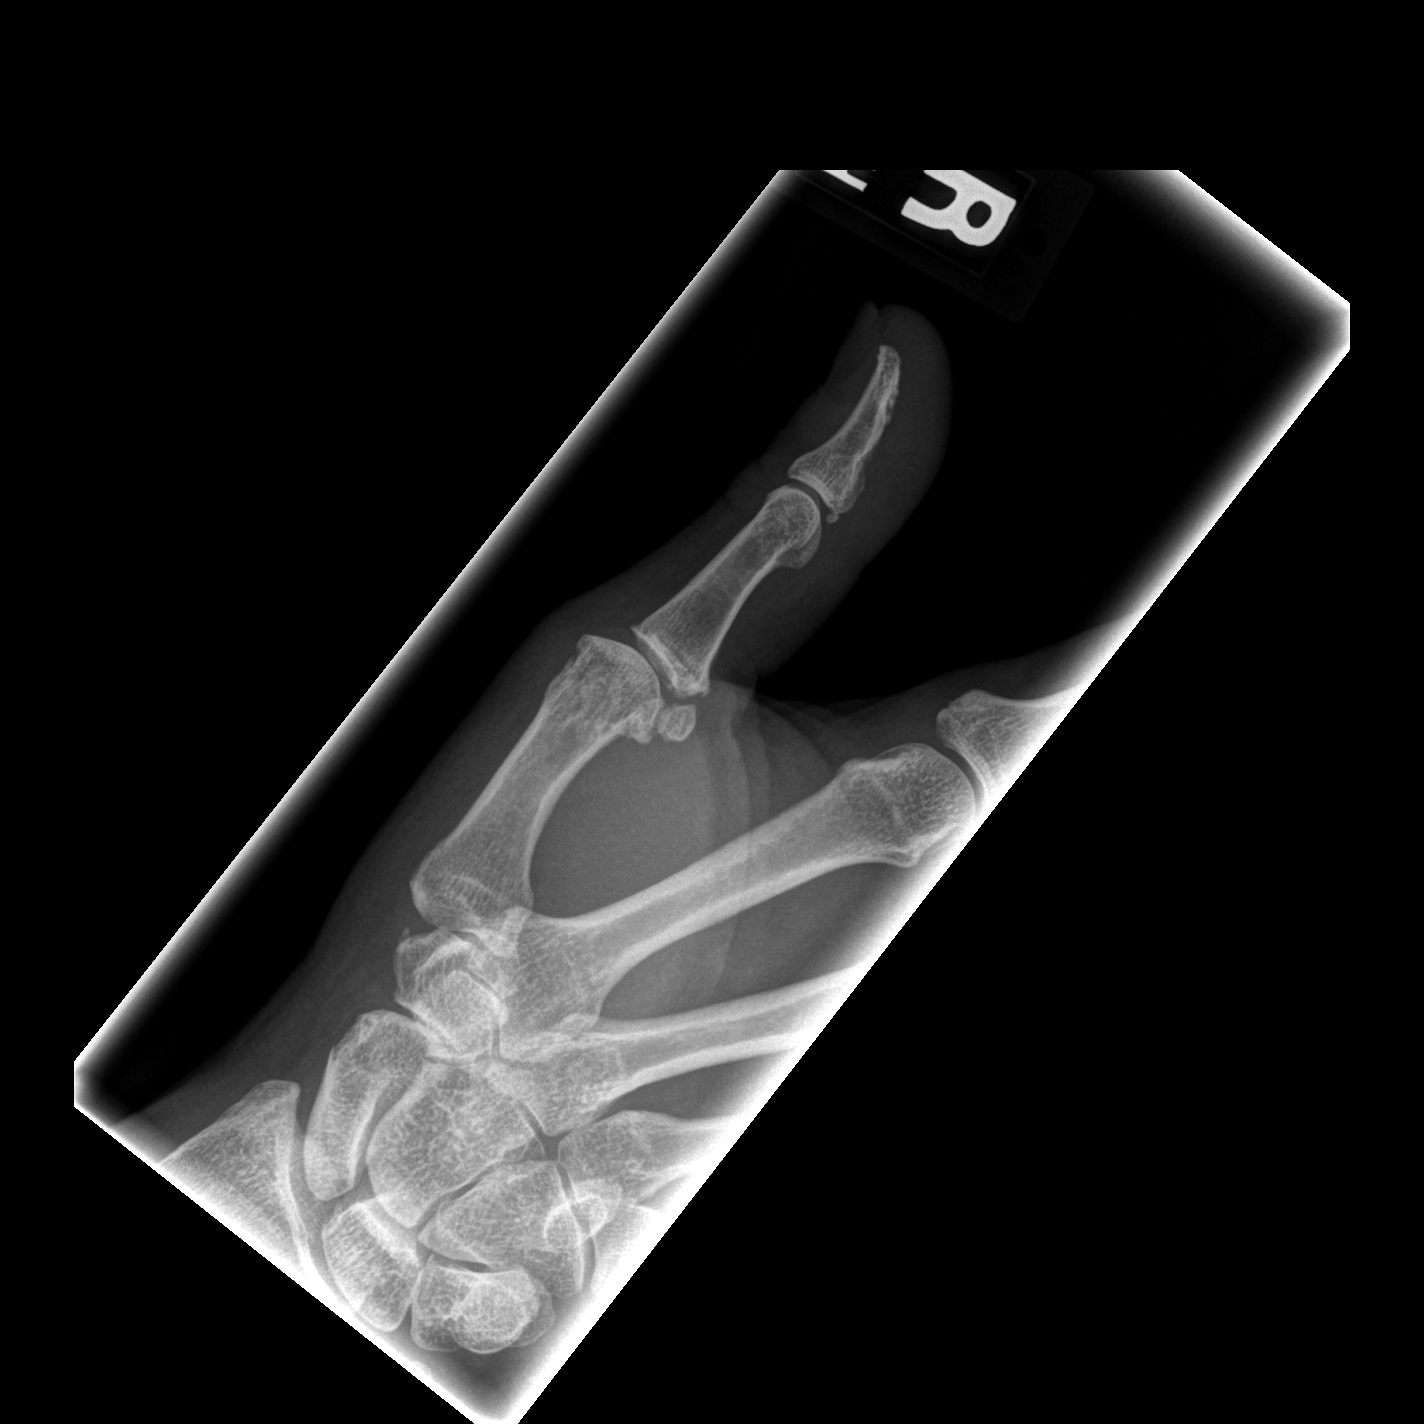

[x finger pa right]
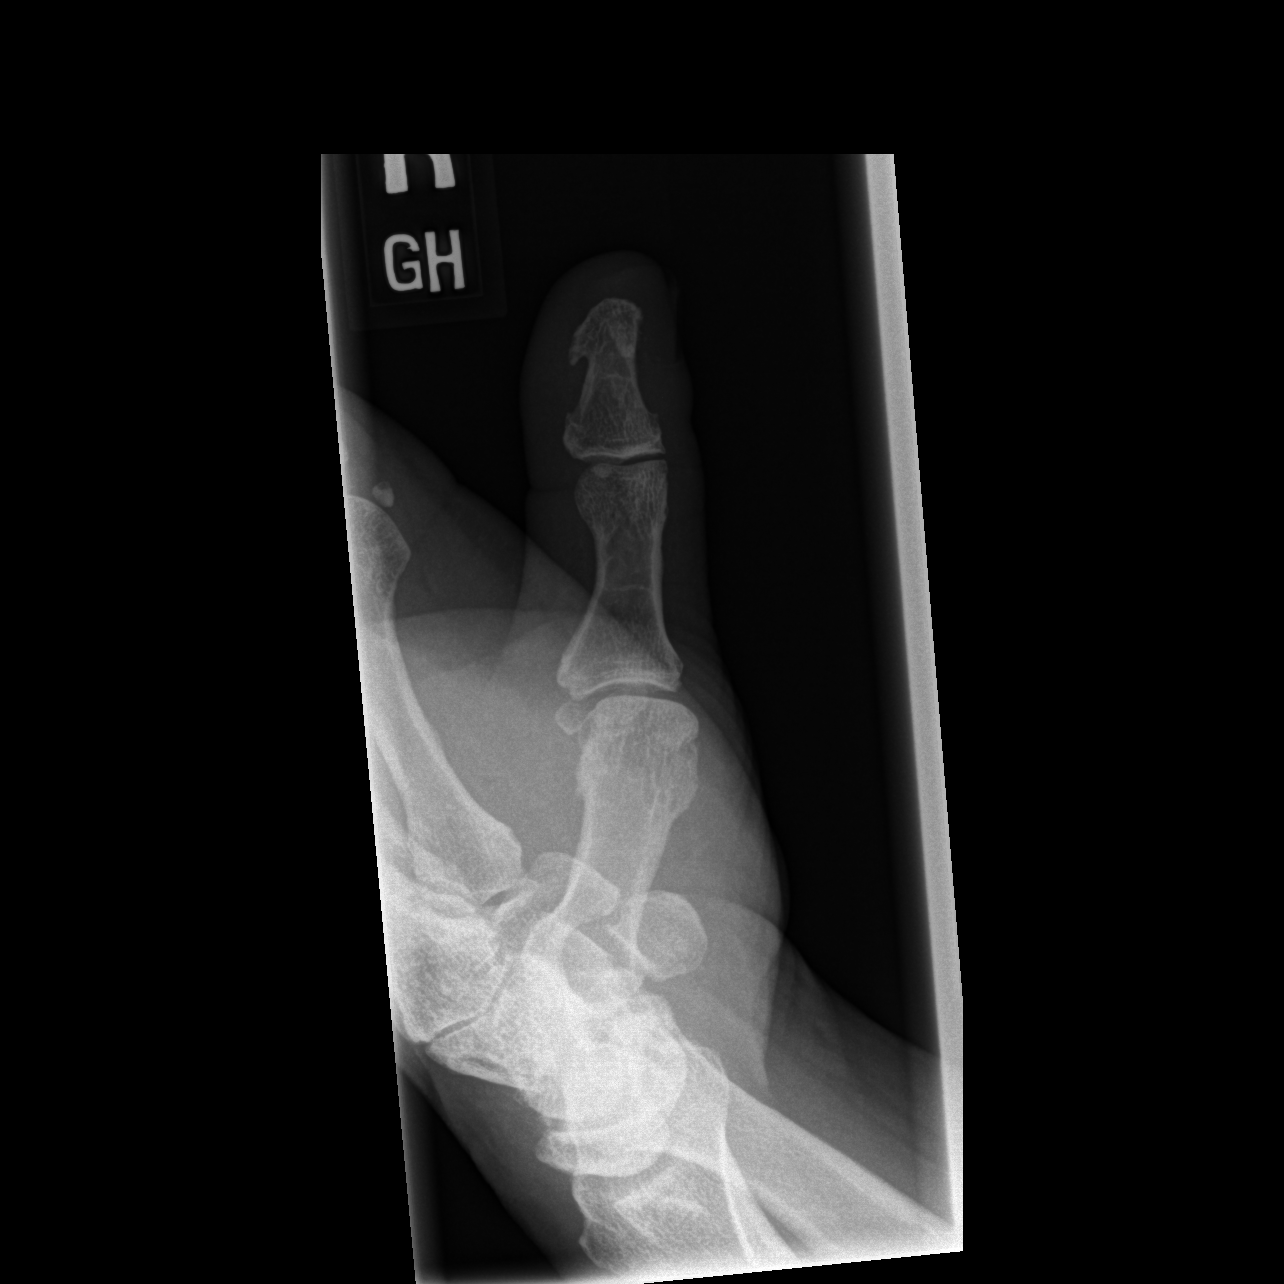

[3 of 3 positions shown; findings below may reference images not displayed]

FINDINGS: Normal alignment. No acute fracture or dislocation. No osseous
erosion. Minimal degenerative arthritis at the first CMC and first
MCP joints of the thumb with subtle osteophyte formation. Joint
spaces are preserved. Soft tissues are unremarkable.
IMPRESSION: No osseous erosion to suggest osteomyelitis.

Minimal degenerative arthritis.

## 2024-03-21 DIAGNOSIS — E7800 Pure hypercholesterolemia, unspecified: Secondary | ICD-10-CM | POA: Diagnosis not present

## 2024-03-21 DIAGNOSIS — Z6828 Body mass index (BMI) 28.0-28.9, adult: Secondary | ICD-10-CM | POA: Diagnosis not present

## 2024-03-23 ENCOUNTER — Ambulatory Visit (HOSPITAL_COMMUNITY)
Admission: EM | Admit: 2024-03-23 | Discharge: 2024-03-23 | Disposition: A | Discharge status: Home / Self Care | Attending: Psychiatry | Admitting: Psychiatry

## 2024-03-23 DIAGNOSIS — R440 Auditory hallucinations: Secondary | ICD-10-CM | POA: Insufficient documentation

## 2024-03-23 NOTE — Discharge Instructions (Addendum)
 Discharge recommendations:   Medications: Patient is to take medications as prescribed. The patient or patient's guardian is to contact a medical professional and/or outpatient provider to address any new side effects that develop. The patient or the patient's guardian should update outpatient providers of any new medications and/or medication changes.    Outpatient Follow up: Please review list of outpatient resources for psychiatry and counseling. Please follow up with your primary care provider for all medical related needs.    Therapy: We recommend that patient participate in individual therapy to address mental health concerns.   Atypical antipsychotics: If you are prescribed an atypical antipsychotic, it is recommended that your height, weight, BMI, blood pressure, fasting lipid panel, and fasting blood sugar be monitored by your outpatient providers.  Safety:   The following safety precautions should be taken:   No sharp objects. This includes scissors, razors, scrapers, and putty knives.   Chemicals should be removed and locked up.   Medications should be removed and locked up.   Weapons should be removed and locked up. This includes firearms, knives and instruments that can be used to cause injury.   The patient should abstain from use of illicit substances/drugs and abuse of any medications.  If symptoms worsen or do not continue to improve or if the patient becomes actively suicidal or homicidal then it is recommended that the patient return to the closest hospital emergency department, the [facility], or call 911 for further evaluation and treatment. National Suicide Prevention Lifeline 1-800-SUICIDE or 747-697-5607.  About 988 988 offers 24/7 access to trained crisis counselors who can help people experiencing mental health-related distress. People can call or text 988 or chat 988lifeline.org for themselves or if they are worried about a  loved one who may need crisis support.    You are encouraged to follow up with [facility] for outpatient treatment.  Walk in/ Open Access Hours: PLEASE ARRIVE AT 7:00AM as walk-in services are first come, first serve.  Monday - Friday 8AM - 10AM (To see provider for medication)  [facility] (2nd floor) [address] [place], [place] [phone]
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
## 2024-04-06 ENCOUNTER — Ambulatory Visit (HOSPITAL_COMMUNITY): Admitting: Physician Assistant

## 2024-04-06 ENCOUNTER — Encounter (HOSPITAL_COMMUNITY): Payer: Self-pay | Admitting: Physician Assistant

## 2024-04-06 VITALS — BP 126/88 | HR 78 | Temp 98.4°F | Ht 67.0 in | Wt 172.8 lb

## 2024-04-06 DIAGNOSIS — F29 Unspecified psychosis not due to a substance or known physiological condition: Secondary | ICD-10-CM

## 2024-04-06 MED ORDER — ARIPIPRAZOLE 10 MG PO TABS: 10.0000 mg | ORAL_TABLET | Freq: Every day | ORAL | 1 refills | Status: DC

## 2024-04-06 MED ORDER — ARIPIPRAZOLE 5 MG PO TABS: ORAL_TABLET | ORAL | 0 refills | Status: AC

## 2024-04-06 NOTE — Progress Notes (Signed)
 Psychiatric Initial Adult Assessment   Patient Identification: Antonio Ball MRN:  [id number] Date of Evaluation:  04/06/2024 Referral Source: Not applicable Chief Complaint:   Chief Complaint  Patient presents with   Establish Care   Medication Management   Visit Diagnosis:    ICD-10-CM   1. Psychosis, unspecified psychosis type (HCC)  F29 ARIPiprazole  (ABILIFY ) 5 MG tablet    ARIPiprazole  (ABILIFY ) 10 MG tablet      History of Present Illness:  ***  Jowan Skillin. [employer]s ***  Associated Signs/Symptoms: Depression Symptoms:  difficulty concentrating, hopelessness, impaired memory, anxiety, panic attacks, (Hypo) Manic Symptoms:  Delusions, Distractibility, Flight of Ideas, Licensed conveyancer, Grandiosity, Hallucinations, Impulsivity, Irritable Mood, Labiality of Mood, Anxiety Symptoms:  Agoraphobia, Excessive Worry, Specific Phobias, Psychotic Symptoms:  Delusions, Hallucinations: Auditory Ideas of Reference, Paranoia, PTSD Symptoms: Negative  Past Psychiatric History:  Patient has a past history of mental illness but is unsure of his specific diagnosis. Patient reports that he has been treating for his mental health in the past when he was attending [facility].  Patient denies a past history of hospitalization due to mental health.  Patient denies a past history of suicide attempt.  Patient denies a past history of homicide attempt.  Previous Psychotropic Medications: Yes , patient is unsure of what medication he has been on in the past.  Substance Abuse History in the last 12 months:  No.  Consequences of Substance Abuse: Patient reports that he used to abuse cocaine for several years.  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  Medical Consequences:  Patient denies Legal Consequences:  Patient reports that he spent 7 to 8 months in prison in 2005 due to possession of drug paraphernalia Family Consequences:  Patient  denies Blackouts:  Patient denies DT's: Patient denies Withdrawal Symptoms:   Tremors Cravings  Past Medical History:  Past Medical History:  Diagnosis Date   Hyperlipidemia     Past Surgical History:  Procedure Laterality Date   LIPOMA EXCISION     Shoulder   MASS EXCISION  04/20/2012   Procedure: EXCISION MASS;  Surgeon: Vicenta DELENA Poli, MD;  Location: [facility] OR;  Service: General;  Laterality: Left;  excision left scrotal nodule    Family Psychiatric History:  Brother - patient reports that his brother suffers from mental health but is unsure what he is diagnosed with.  Family history of suicide attempt: Patient denies Family history of homicide attempt: Patient reports that his nephew has murdered someone in the past. Family history of substance abuse: Patient denies  Family History: History reviewed. No pertinent family history.  Social History:   Social History   Socioeconomic History   Marital status: Married    Spouse name: Not on file   Number of children: Not on file   Years of education: Not on file   Highest education level: Not on file  Occupational History   Not on file  Tobacco Use   Smoking status: Every Day    Current packs/day: 0.50    Average packs/day: 0.5 packs/day for 20.0 years (10.0 ttl pk-yrs)    Types: Cigarettes   Smokeless tobacco: Never  Substance and Sexual Activity   Alcohol use: Yes    Comment: 1 beer a month   Drug use: Yes    Types: Marijuana   Sexual activity: Not Currently  Other Topics Concern   Not on file  Social History Narrative   Not on file   Social Drivers of Health  Financial Resource Strain: Not on file  Food Insecurity: Not on file  Transportation Needs: Not on file  Physical Activity: Not on file  Stress: Not on file  Social Connections: Unknown (11/25/2021)   Received from [facility]   Social Network    Social Network: Not on file    Additional Social History:  Patient endorses social support.   Patient denies having children of his own.  Patient endorses housing.  Patient is currently unemployed.  Patient denies a past history of military experience.  Patient has been incarcerated in the past.  He informed provider that he was placed in prison for 7 to 8 months due to possession of drug paraphernalia.  Patient reports that he recently spent 3 days in jail due to firing his firearm.  Patient reports that he has only completed sixth or seventh grade.  Patient denies access to weapons.  Allergies:  No Known Allergies  Metabolic Disorder Labs: No results found for: HGBA1C, MPG No results found for: PROLACTIN No results found for: CHOL, TRIG, HDL, CHOLHDL, VLDL, LDLCALC No results found for: TSH  Therapeutic Level Labs: No results found for: LITHIUM No results found for: CBMZ No results found for: VALPROATE  Current Medications: Current Outpatient Medications  Medication Sig Dispense Refill   ARIPiprazole  (ABILIFY ) 10 MG tablet Take 1 tablet (10 mg total) by mouth daily. 30 tablet 1   ARIPiprazole  (ABILIFY ) 5 MG tablet Patient to take Abilify  5 mg daily for 6 days then increase to 10 mg daily. 6 tablet 0   gemfibrozil (LOPID) 600 MG tablet Take 600 mg by mouth 2 (two) times daily.     ibuprofen  (ADVIL ) 800 MG tablet Take 1 tablet (800 mg total) by mouth 3 (three) times daily with meals. 21 tablet 0   Pitavastatin Calcium (LIVALO) 4 MG TABS Take by mouth daily.     No current facility-administered medications for this visit.    Musculoskeletal: Strength & Muscle Tone: within normal limits Gait & Station: normal Patient leans: N/A  Psychiatric Specialty Exam: Review of Systems  Psychiatric/Behavioral:  Positive for dysphoric mood and hallucinations. Negative for decreased concentration, self-injury, sleep disturbance and suicidal ideas. The patient is nervous/anxious. The patient is not hyperactive.     Blood pressure 126/88, pulse 78, temperature 98.4  F (36.9 C), temperature source Oral, height 5' 7 (1.702 m), weight 172 lb 12.8 oz (78.4 kg), SpO2 99%.Body mass index is 27.06 kg/m.  General Appearance: Casual  Eye Contact:  Good  Speech:  Clear and Coherent and Normal Rate  Volume:  Normal  Mood:  Anxious and Depressed  Affect:  Appropriate  Thought Process:  Coherent, Goal Directed, and Descriptions of Associations: Intact  Orientation:  Full (Time, Place, and Person)  Thought Content:  WDL and Hallucinations: Auditory  Suicidal Thoughts:  No  Homicidal Thoughts:  No  Memory:  Immediate;   Good Recent;   Good Remote;   Fair  Judgement:  Fair  Insight:  Fair  Psychomotor Activity:  Normal  Concentration:  Concentration: Good and Attention Span: Good  Recall:  Fair  Fund of Knowledge:Fair  Language: Good  Akathisia:  No  Handed:  Right  AIMS (if indicated):  not done  Assets:  Communication Skills Desire for Improvement Housing Social Support  ADL's:  Intact  Cognition: WNL  Sleep:  Good   Screenings: GAD-7    Flowsheet Row Office Visit from 04/06/2024 in [facility]  Total GAD-7 Score 14  PHQ2-9    Flowsheet Row Office Visit from 04/06/2024 in [facility]  PHQ-2 Total Score 4  PHQ-9 Total Score 18   Flowsheet Row Office Visit from 04/06/2024 in [facility] ED from 03/23/2024 in [facility] UC from 03/08/2023 in [facility] RISK CATEGORY No Risk No Risk No Risk    Assessment and Plan: ***  ***  Collaboration of Care: Medication Management AEB provider managing patient's psychiatric medications, Primary Care Provider AEB patient being seen by primary care provider, and Psychiatrist AEB provider managing patient's psychiatric medications  Patient/Guardian was advised Release of Information must be obtained prior to any record release in order to collaborate their  care with an outside provider. Patient/Guardian was advised if they have not already done so to contact the registration department to sign all necessary forms in order for us  to release information regarding their care.   Consent: Patient/Guardian gives verbal consent for treatment and assignment of benefits for services provided during this visit. Patient/Guardian expressed understanding and agreed to proceed.   1. Psychosis, unspecified psychosis type (HCC) (Primary)  - ARIPiprazole  (ABILIFY ) 5 MG tablet; Patient to take Abilify  5 mg daily for 6 days then increase to 10 mg daily.  Dispense: 6 tablet; Refill: 0 - ARIPiprazole  (ABILIFY ) 10 MG tablet; Take 1 tablet (10 mg total) by mouth daily.  Dispense: 30 tablet; Refill: 1  Patient to follow-up in 6 weeks Provider spent a total of 40 minutes with the patient/reviewing patient's chart  Reginia FORBES Bolster, PA 9/25/20257:36 PM

## 2024-05-01 ENCOUNTER — Ambulatory Visit (HOSPITAL_COMMUNITY)
Admission: EM | Admit: 2024-05-01 | Discharge: 2024-05-01 | Disposition: A | Discharge status: Home / Self Care | Attending: Emergency Medicine | Admitting: Emergency Medicine

## 2024-05-01 ENCOUNTER — Encounter (HOSPITAL_COMMUNITY): Payer: Self-pay

## 2024-05-01 DIAGNOSIS — L089 Local infection of the skin and subcutaneous tissue, unspecified: Secondary | ICD-10-CM | POA: Diagnosis not present

## 2024-05-01 DIAGNOSIS — Z23 Encounter for immunization: Secondary | ICD-10-CM | POA: Diagnosis not present

## 2024-05-01 MED ORDER — AMOXICILLIN-POT CLAVULANATE 875-125 MG PO TABS: 1.0000 | ORAL_TABLET | Freq: Two times a day (BID) | ORAL | 0 refills | Status: AC

## 2024-05-01 MED ORDER — TETANUS-DIPHTH-ACELL PERTUSSIS 5-2-15.5 LF-MCG/0.5 IM SUSP
INTRAMUSCULAR | Status: AC
  Filled 2024-05-01: qty 0.5

## 2024-05-01 MED ORDER — TETANUS-DIPHTH-ACELL PERTUSSIS 5-2-15.5 LF-MCG/0.5 IM SUSP
0.5000 mL | Freq: Once | INTRAMUSCULAR | Status: AC
  Administered 2024-05-01: 0.5 mL via INTRAMUSCULAR

## 2024-05-01 NOTE — ED Provider Notes (Signed)
 [facility]    CSN: [id number] Arrival date & time: 05/01/24  1700      History   Chief Complaint No chief complaint on file.   HPI Antonio Ball is a 68 y.o. male.   Patient presents to clinic over concern of right thumb pain for the past week.  Thinks he may have hit it while he was messing around changing his spark plugs around a week ago, did not hit or splash his finger.  Did not cut finger.  Thinks his last Tdap was updated around 20 years ago or so.  Has not had any drainage from the area.  Has been putting antibiotic cream on the right thumb.  Area is painful and hard to bend.  The swollen and erythematous.  Pain along the lateral thumb down to the nailbed.  The history is provided by the patient and medical records.    Past Medical History:  Diagnosis Date   Hyperlipidemia     Patient Active Problem List   Diagnosis Date Noted   Pain in left shoulder 10/22/2021   Scrotal nodule 04/12/2012    Past Surgical History:  Procedure Laterality Date   LIPOMA EXCISION     Shoulder   MASS EXCISION  04/20/2012   Procedure: EXCISION MASS;  Surgeon: Vicenta DELENA Poli, MD;  Location: [facility] OR;  Service: General;  Laterality: Left;  excision left scrotal nodule       Home Medications    Prior to Admission medications   Medication Sig Start Date End Date Taking? Authorizing Provider  amoxicillin -clavulanate (AUGMENTIN) 875-125 MG tablet Take 1 tablet by mouth every 12 (twelve) hours. 05/01/24  Yes Cyndie Woodbeck  N, FNP  ARIPiprazole  (ABILIFY ) 10 MG tablet Take 1 tablet (10 mg total) by mouth daily. 04/06/24   Nwoko, Uchenna E, PA  ARIPiprazole  (ABILIFY ) 5 MG tablet Patient to take Abilify  5 mg daily for 6 days then increase to 10 mg daily. Patient not taking: Reported on 05/01/2024 04/06/24   Nwoko, Uchenna E, PA  gemfibrozil (LOPID) 600 MG tablet Take 600 mg by mouth 2 (two) times daily. 12/22/22   [provider]  ibuprofen  (ADVIL ) 800 MG tablet  Take 1 tablet (800 mg total) by mouth 3 (three) times daily with meals. Patient not taking: Reported on 05/01/2024 06/27/22   Rolinda Rogue, MD  Pitavastatin Calcium (LIVALO) 4 MG TABS Take by mouth daily.    [provider]    Family History History reviewed. No pertinent family history.  Social History Social History   Tobacco Use   Smoking status: Every Day    Current packs/day: 0.50    Average packs/day: 0.5 packs/day for 20.0 years (10.0 ttl pk-yrs)    Types: Cigarettes   Smokeless tobacco: Never  Vaping Use   Vaping status: Never Used  Substance Use Topics   Alcohol use: Yes    Comment: 1 beer a month   Drug use: Yes    Types: Marijuana     Allergies   Patient has no known allergies.   Review of Systems Review of Systems  Per HPI  Physical Exam Triage Vital Signs ED Triage Vitals [05/01/24 1817]  Encounter Vitals Group     BP (!) 150/96     Girls Systolic BP Percentile      Girls Diastolic BP Percentile      Boys Systolic BP Percentile      Boys Diastolic BP Percentile      Pulse Rate 93  Resp 16     Temp 98.9 F (37.2 C)     Temp Source Oral     SpO2 94 %     Weight      Height      Head Circumference      Peak Flow      Pain Score 9     Pain Loc      Pain Education      Exclude from Growth Chart    No data found.  Updated Vital Signs BP (!) 150/96 (BP Location: Left Arm)   Pulse 93   Temp 98.9 F (37.2 C) (Oral)   Resp 16   SpO2 94%   Visual Acuity Right Eye Distance:   Left Eye Distance:   Bilateral Distance:    Right Eye Near:   Left Eye Near:    Bilateral Near:     Physical Exam Vitals and nursing note reviewed.  Constitutional:      Appearance: Normal appearance.  HENT:     Head: Normocephalic and atraumatic.     Right Ear: External ear normal.     Left Ear: External ear normal.     Nose: Nose normal.     Mouth/Throat:     Mouth: Mucous membranes are moist.  Eyes:     Conjunctiva/sclera: Conjunctivae  normal.  Cardiovascular:     Rate and Rhythm: Normal rate.  Pulmonary:     Effort: Pulmonary effort is normal. No respiratory distress.  Musculoskeletal:        General: Normal range of motion.  Skin:    General: Skin is warm and dry.      Neurological:     General: No focal deficit present.     Mental Status: He is alert and oriented to person, place, and time.  Psychiatric:        Mood and Affect: Mood normal.        Behavior: Behavior normal. Behavior is cooperative.      UC Treatments / Results  Labs (all labs ordered are listed, but only abnormal results are displayed) Labs Reviewed - No data to display  EKG   Radiology No results found.  Procedures Procedures (including critical care time)  Medications Ordered in UC Medications  Tdap (ADACEL) injection 0.5 mL (has no administration in time range)    Initial Impression / Assessment and Plan / UC Course  I have reviewed the triage vital signs and the nursing notes.  Pertinent labs & imaging results that were available during my care of the patient were reviewed by me and considered in my medical decision making (see chart for details).  Vitals and triage reviewed, patient is hemodynamically stable.  Afebrile without tachycardia, low concern for systemic infection.  No obvious injury, no crush injury and full range of motion, imaging deferred at this time.  Suspect paronychia of the right lateral thumb, will cover with Augmentin.  Tdap updated in clinic due to prolonged duration of previous Tdap and car work.  Pain management wound care discussed.  Plan of care, follow-up care return precautions given, no questions at this time.    Final Clinical Impressions(s) / UC Diagnoses   Final diagnoses:  Infection of thumb     Discharge Instructions      Your thumb appears to be infected.  Soak it twice daily in warm water and an antibacterial solutions such as Dial soap or Hibiclens.  Afterwards pat dry.  Take  the oral antibiotics twice daily with  dinner and breakfast.  You take ibuprofen  as needed for pain and swelling.  Symptoms should improve over the next few days with antibiotics.  If you do not have improvement please return to clinic for reevaluation.  We have updated your Tdap today in clinic, you are covered for 10 years for basic injuries or 5 years for dirty injuries / injuries with metal.     ED Prescriptions     Medication Sig Dispense Auth. Provider   amoxicillin -clavulanate (AUGMENTIN) 875-125 MG tablet Take 1 tablet by mouth every 12 (twelve) hours. 14 tablet Dreama, Oakleigh Hesketh  N, FNP      PDMP not reviewed this encounter.   Dreama, Keilyn Nadal  N, FNP 05/01/24 [phone]

## 2024-05-01 NOTE — Discharge Instructions (Signed)
 Your thumb appears to be infected.  Soak it twice daily in warm water and an antibacterial solutions such as Dial soap or Hibiclens.  Afterwards pat dry.  Take the oral antibiotics twice daily with dinner and breakfast.  You take ibuprofen  as needed for pain and swelling.  Symptoms should improve over the next few days with antibiotics.  If you do not have improvement please return to clinic for reevaluation.  We have updated your Tdap today in clinic, you are covered for 10 years for basic injuries or 5 years for dirty injuries / injuries with metal.

## 2024-05-01 NOTE — ED Triage Notes (Signed)
 Patient reports that he hit his right thumb on something, but does not remember what. Patient has swelling and  pain to the right thumb x 1 week.  Patient states he has been putting an antibiotic cream  on the right thumb.

## 2024-05-18 ENCOUNTER — Ambulatory Visit (INDEPENDENT_AMBULATORY_CARE_PROVIDER_SITE_OTHER): Admitting: Physician Assistant

## 2024-05-18 ENCOUNTER — Encounter (HOSPITAL_COMMUNITY): Payer: Self-pay | Admitting: Physician Assistant

## 2024-05-18 DIAGNOSIS — F29 Unspecified psychosis not due to a substance or known physiological condition: Secondary | ICD-10-CM

## 2024-05-18 MED ORDER — ARIPIPRAZOLE 10 MG PO TABS: 10.0000 mg | ORAL_TABLET | Freq: Every day | ORAL | 1 refills | Status: DC

## 2024-05-18 NOTE — Progress Notes (Signed)
 BH MD/PA/NP OP Progress Note  05/18/2024 7:30 PM Antonio Ball  MRN:  [id number]  Chief Complaint:  Chief Complaint  Patient presents with   Follow-up   Medication Refill   HPI:   Antonio Ball. Antonio Ball is a 68 year old male with a past psychiatric history significant for psychosis (unspecified psychosis type) who presents to [facility] for follow-up and medication management.  Patient is currently being managed on the following psychiatric medications: Abilify  10 mg daily.  Since the last encounter, patient reports that he has not experienced any side effects associated from his use of Abilify .  He reports that he continues to take the medication regularly.  Patient denies overt depressive symptoms nor does he endorse anxiety.  Patient denies any changes in his behavior.  He reports that he still occasionally experiences some paranoia but not at the level he was experiencing before hand.  Patient denies auditory or visual hallucinations.  Patient denies social withdrawal but states that he will occasionally keep to himself.  A PHQ-9 screen was performed with the patient scoring a 2.  A GAD-7 screen was also performed with the patient scoring a 0.  Patient is alert and oriented x 4, calm, cooperative, and fully engaged in conversation during the encounter.  Patient endorses good mood.  Patient exhibits euthymic mood with appropriate affect.  Patient denies suicidal or homicidal ideations.  He further denies auditory or visual hallucinations and does not appear to be responding to internal/external stimuli.  Patient endorses good sleep.  Patient endorses good appetite and eats on average 2 meals per day.  Patient endorses alcohol consumption on occasion and states that he has a beer roughly every 5 days.  Patient endorses tobacco use and smokes on average 5 to 6 cigarettes/day.  Patient denies illicit drug use.  Visit Diagnosis:    ICD-10-CM   1. Psychosis,  unspecified psychosis type (HCC)  F29 ARIPiprazole  (ABILIFY ) 10 MG tablet      Past Psychiatric History:  Patient has a past history of mental illness but is unsure of his specific diagnosis. Patient reports that he has been treating for his mental health in the past when he was attending [facility].   Patient denies a past history of hospitalization due to mental health.   Patient denies a past history of suicide attempt.   Patient denies a past history of homicide attempt.  Past Medical History:  Past Medical History:  Diagnosis Date   Hyperlipidemia     Past Surgical History:  Procedure Laterality Date   LIPOMA EXCISION     Shoulder   MASS EXCISION  04/20/2012   Procedure: EXCISION MASS;  Surgeon: Antonio DELENA Poli, MD;  Location: [facility] OR;  Service: General;  Laterality: Left;  excision left scrotal nodule    Family Psychiatric History:  Brother - patient reports that his brother suffers from mental health but is unsure what he is diagnosed with.   Family history of suicide attempt: Patient denies Family history of homicide attempt: Patient reports that his nephew has murdered someone in the past. Family history of substance abuse: Patient denies  Family History: History reviewed. No pertinent family history.  Social History:  Social History   Socioeconomic History   Marital status: Married    Spouse name: Not on file   Number of children: Not on file   Years of education: Not on file   Highest education level: Not on file  Occupational History   Not  on file  Tobacco Use   Smoking status: Every Day    Current packs/day: 0.50    Average packs/day: 0.5 packs/day for 20.0 years (10.0 ttl pk-yrs)    Types: Cigarettes   Smokeless tobacco: Never  Vaping Use   Vaping status: Never Used  Substance and Sexual Activity   Alcohol use: Yes    Comment: 1 beer a month   Drug use: Yes    Types: Marijuana   Sexual activity: Not Currently  Other Topics  Concern   Not on file  Social History Narrative   Not on file   Social Drivers of Health   Financial Resource Strain: Not on file  Food Insecurity: Not on file  Transportation Needs: Not on file  Physical Activity: Not on file  Stress: Not on file  Social Connections: Unknown (11/25/2021)   Received from [employer]   Social Network    Social Network: Not on file    Allergies: No Known Allergies  Metabolic Disorder Labs: No results found for: HGBA1C, MPG No results found for: PROLACTIN No results found for: CHOL, TRIG, HDL, CHOLHDL, VLDL, LDLCALC No results found for: TSH  Therapeutic Level Labs: No results found for: LITHIUM No results found for: VALPROATE No results found for: CBMZ  Current Medications: Current Outpatient Medications  Medication Sig Dispense Refill   amoxicillin -clavulanate (AUGMENTIN) 875-125 MG tablet Take 1 tablet by mouth every 12 (twelve) hours. 14 tablet 0   ARIPiprazole  (ABILIFY ) 10 MG tablet Take 1 tablet (10 mg total) by mouth daily. 30 tablet 1   ARIPiprazole  (ABILIFY ) 5 MG tablet Patient to take Abilify  5 mg daily for 6 days then increase to 10 mg daily. (Patient not taking: Reported on 05/01/2024) 6 tablet 0   gemfibrozil (LOPID) 600 MG tablet Take 600 mg by mouth 2 (two) times daily.     ibuprofen  (ADVIL ) 800 MG tablet Take 1 tablet (800 mg total) by mouth 3 (three) times daily with meals. (Patient not taking: Reported on 05/01/2024) 21 tablet 0   Pitavastatin Calcium (LIVALO) 4 MG TABS Take by mouth daily.     No current facility-administered medications for this visit.     Musculoskeletal: Strength & Muscle Tone: within normal limits Gait & Station: normal Patient leans: N/A  Psychiatric Specialty Exam: Review of Systems  Psychiatric/Behavioral:  Negative for decreased concentration, dysphoric mood, hallucinations, self-injury, sleep disturbance and suicidal ideas. The patient is not nervous/anxious and  is not hyperactive.     Blood pressure 133/89, pulse 93, temperature 98.2 F (36.8 C), temperature source Oral, height 5' 7 (1.702 m), weight 172 lb (78 kg), SpO2 98%.Body mass index is 26.94 kg/m.  General Appearance: Casual  Eye Contact:  Good  Speech:  Clear and Coherent and Normal Rate  Volume:  Normal  Mood:  Euthymic  Affect:  Appropriate  Thought Process:  Coherent, Goal Directed, and Descriptions of Associations: Intact  Orientation:  Full (Time, Place, and Person)  Thought Content: WDL   Suicidal Thoughts:  No  Homicidal Thoughts:  No  Memory:  Immediate;   Good Recent;   Good Remote;   Fair  Judgement:  Fair  Insight:  Fair  Psychomotor Activity:  Normal  Concentration:  Concentration: Good and Attention Span: Good  Recall:  [employer] of Knowledge: Fair  Language: Good  Akathisia:  No  Handed:  Right  AIMS (if indicated): done; 0  Assets:  Communication Skills Desire for Improvement Housing Social Support  ADL's:  Intact  Cognition: WNL  Sleep:  Good   Screenings: AIMS    Flowsheet Row Clinical Support from 05/18/2024 in [facility]  AIMS Total Score 0   GAD-7    Flowsheet Row Clinical Support from 05/18/2024 in [facility] Office Visit from 04/06/2024 in [facility]  Total GAD-7 Score 0 14   PHQ2-9    Flowsheet Row Clinical Support from 05/18/2024 in [facility] Office Visit from 04/06/2024 in [facility]  PHQ-2 Total Score 2 4  PHQ-9 Total Score 2 18   Flowsheet Row Clinical Support from 05/18/2024 in [facility] from 05/01/2024 in [facility] Visit from 04/06/2024 in [facility]  C-SSRS RISK CATEGORY No Risk No Risk No Risk     Assessment and Plan:   Jelan Batterton. Peel is a 68 year old male with a past psychiatric  history significant for psychosis (unspecified psychosis type) who presents to [facility] for follow-up and medication management.  Patient presents to the encounter stating that he continues to take his Abilify  as prescribed and denies experiencing any adverse side effects associated with the use of the medication. An aims assessment was performed with the patient scoring a 0.  Since using Abilify , patient denies experiencing any changes in his behavior he reports that he experiences some paranoia but not to the same degree that he was experiencing prior to being placed on Abilify .  Patient denies auditory or visual hallucinations and does not appear to be responding to internal/external stimuli.  Patient denies overt depressive symptoms nor does he endorse anxiety.  A PHQ-9 screen was performed with the patient scoring a 2.  A GAD-7 screen was also performed with the patient scoring a 0.  Patient endorses stability on his current medication regimen and would like to continue taking his medication as prescribed.  Patient's medication to be e-prescribed to pharmacy of choice.  A [place] Suicide Severity Rating Scale was performed with the patient being considered no risk.  Patient denies suicidal ideations and is able to contract for safety at this time.    Collaboration of Care: Collaboration of Care: Medication Management AEB provider managing patient's psychiatric medications, Primary Care Provider AEB patient being seen by a primary care provider, and Psychiatrist AEB  patient being followed by mental health provider at this facility  Patient/Guardian was advised Release of Information must be obtained prior to any record release in order to collaborate their care with an outside provider. Patient/Guardian was advised if they have not already done so to contact the registration department to sign all necessary forms in order for us  to release information  regarding their care.   Consent: Patient/Guardian gives verbal consent for treatment and assignment of benefits for services provided during this visit. Patient/Guardian expressed understanding and agreed to proceed.   1. Psychosis, unspecified psychosis type (HCC)  - ARIPiprazole  (ABILIFY ) 10 MG tablet; Take 1 tablet (10 mg total) by mouth daily.  Dispense: 30 tablet; Refill: 1  Patient to follow-up in 6 weeks Provider spent a total of 17 minutes with the patient/reviewing patient's chart  Reginia FORBES Bolster, PA 05/18/2024, 7:30 PM

## 2024-06-29 ENCOUNTER — Encounter (HOSPITAL_COMMUNITY): Admitting: Physician Assistant

## 2024-07-19 ENCOUNTER — Ambulatory Visit (INDEPENDENT_AMBULATORY_CARE_PROVIDER_SITE_OTHER): Admitting: Physician Assistant

## 2024-07-19 DIAGNOSIS — F29 Unspecified psychosis not due to a substance or known physiological condition: Secondary | ICD-10-CM

## 2024-07-20 ENCOUNTER — Encounter (HOSPITAL_COMMUNITY): Admitting: Physician Assistant

## 2024-08-11 ENCOUNTER — Encounter (HOSPITAL_COMMUNITY): Payer: Self-pay | Admitting: Physician Assistant

## 2024-08-11 MED ORDER — ARIPIPRAZOLE 10 MG PO TABS: 10.0000 mg | ORAL_TABLET | Freq: Every day | ORAL | 2 refills | Status: AC

## 2024-08-11 NOTE — Progress Notes (Cosign Needed)
 BH MD/PA/NP OP Progress Note  07/19/2024 3:00 PM Antonio Ball  MRN:  [id number]  Chief Complaint:  Chief Complaint  Patient presents with   Follow-up   Medication Refill   HPI:   Antonio Ball. Norgaard is a 69 year old male with a past psychiatric history significant for psychosis (unspecified psychosis type) who presents to [facility] for follow-up and medication management.  Patient is currently being managed on the following psychiatric medications: Abilify  10 mg daily.  Patient presents to the encounter stating that he has been taking his Abilify  regularly and denies experiencing any adverse side effects.  Patient denies overt depressive symptoms and states that his anxiety has been virtually nonexistent.  Patient denies changes in his behavior nor does he endorse paranoia.  Patient further denies auditory or visual hallucinations.  Patient denies any other stressors at this time.  A PHQ 9 screen was performed with the patient scoring a 12.  A GAD-7 screen was also performed with the patient scoring a 12.  Patient is alert and oriented x 4, calm, cooperative, and fully engaged in conversation during the encounter.  Patient endorses good mood.  Patient exhibits euthymic mood with appropriate affect.  Patient denies suicidal or homicidal ideations.  He further denies auditory or visual hallucinations and does not appear to be responding to internal/external stimuli.  Patient endorses good sleep and receives on average 6 to 7 hours of sleep per night.  Patient endorses fair appetite and eats on average 1-1/2 meals per day.  Patient endorses alcohol consumption sparingly.  Patient endorses tobacco use and smokes on average 3 to 4 cigarettes/day.  Patient denies illicit drug use.  Visit Diagnosis:    ICD-10-CM   1. Psychosis, unspecified psychosis type (HCC)  F29 ARIPiprazole  (ABILIFY ) 10 MG tablet      Past Psychiatric History:  Patient has a past history  of mental illness but is unsure of his specific diagnosis. Patient reports that he has been treating for his mental health in the past when he was attending [facility].   Patient denies a past history of hospitalization due to mental health.   Patient denies a past history of suicide attempt.   Patient denies a past history of homicide attempt.  Past Medical History:  Past Medical History:  Diagnosis Date   Hyperlipidemia     Past Surgical History:  Procedure Laterality Date   LIPOMA EXCISION     Shoulder   MASS EXCISION  04/20/2012   Procedure: EXCISION MASS;  Surgeon: Vicenta DELENA Poli, MD;  Location: [facility] OR;  Service: General;  Laterality: Left;  excision left scrotal nodule    Family Psychiatric History:  Brother - patient reports that his brother suffers from mental health but is unsure what he is diagnosed with.   Family history of suicide attempt: Patient denies Family history of homicide attempt: Patient reports that his nephew has murdered someone in the past. Family history of substance abuse: Patient denies  Family History: History reviewed. No pertinent family history.  Social History:  Social History   Socioeconomic History   Marital status: Married    Spouse name: Not on file   Number of children: Not on file   Years of education: Not on file   Highest education level: Not on file  Occupational History   Not on file  Tobacco Use   Smoking status: Every Day    Current packs/day: 0.50    Average packs/day: 0.5 packs/day for  20.0 years (10.0 ttl pk-yrs)    Types: Cigarettes   Smokeless tobacco: Never  Vaping Use   Vaping status: Never Used  Substance and Sexual Activity   Alcohol use: Yes    Comment: 1 beer a month   Drug use: Yes    Types: Marijuana   Sexual activity: Not Currently  Other Topics Concern   Not on file  Social History Narrative   Not on file   Social Drivers of Health   Tobacco Use: High Risk (08/11/2024)   Patient  History    Smoking Tobacco Use: Every Day    Smokeless Tobacco Use: Never    Passive Exposure: Not on file  Financial Resource Strain: Not on file  Food Insecurity: Not on file  Transportation Needs: Not on file  Physical Activity: Not on file  Stress: Not on file  Social Connections: Unknown (11/25/2021)   Received from [facility]   Social Network    Social Network: Not on file  Depression (PHQ2-9): High Risk (07/19/2024)   Depression (PHQ2-9)    PHQ-2 Score: 12  Alcohol Screen: Not on file  Housing: Not on file  Utilities: Not on file  Health Literacy: Not on file    Allergies: No Known Allergies  Metabolic Disorder Labs: No results found for: HGBA1C, MPG No results found for: PROLACTIN No results found for: CHOL, TRIG, HDL, CHOLHDL, VLDL, LDLCALC No results found for: TSH  Therapeutic Level Labs: No results found for: LITHIUM No results found for: VALPROATE No results found for: CBMZ  Current Medications: Current Outpatient Medications  Medication Sig Dispense Refill   amoxicillin -clavulanate (AUGMENTIN ) 875-125 MG tablet Take 1 tablet by mouth every 12 (twelve) hours. 14 tablet 0   ARIPiprazole  (ABILIFY ) 10 MG tablet Take 1 tablet (10 mg total) by mouth daily. 30 tablet 2   ARIPiprazole  (ABILIFY ) 5 MG tablet Patient to take Abilify  5 mg daily for 6 days then increase to 10 mg daily. (Patient not taking: Reported on 05/01/2024) 6 tablet 0   gemfibrozil (LOPID) 600 MG tablet Take 600 mg by mouth 2 (two) times daily.     ibuprofen  (ADVIL ) 800 MG tablet Take 1 tablet (800 mg total) by mouth 3 (three) times daily with meals. (Patient not taking: Reported on 05/01/2024) 21 tablet 0   Pitavastatin Calcium (LIVALO) 4 MG TABS Take by mouth daily.     No current facility-administered medications for this visit.     Musculoskeletal: Strength & Muscle Tone: within normal limits Gait & Station: normal Patient leans: N/A  Psychiatric Specialty  Exam: Review of Systems  Psychiatric/Behavioral:  Negative for decreased concentration, dysphoric mood, hallucinations, self-injury, sleep disturbance and suicidal ideas. The patient is not nervous/anxious and is not hyperactive.     Blood pressure 128/83, pulse 90, temperature 98.3 F (36.8 C), temperature source Oral, height 5' 7 (1.702 m), weight 175 lb (79.4 kg), SpO2 97%.Body mass index is 27.41 kg/m.  General Appearance: Casual  Eye Contact:  Good  Speech:  Clear and Coherent and Normal Rate  Volume:  Normal  Mood:  Euthymic  Affect:  Appropriate  Thought Process:  Coherent, Goal Directed, and Descriptions of Associations: Intact  Orientation:  Full (Time, Place, and Person)  Thought Content: WDL   Suicidal Thoughts:  No  Homicidal Thoughts:  No  Memory:  Immediate;   Good Recent;   Good Remote;   Fair  Judgement:  Fair  Insight:  Fair  Psychomotor Activity:  Normal  Concentration:  Concentration: Good  and Attention Span: Good  Recall:  [employer] of Knowledge: Fair  Language: Good  Akathisia:  No  Handed:  Right  AIMS (if indicated): done; 0  Assets:  Communication Skills Desire for Improvement Housing Social Support  ADL's:  Intact  Cognition: WNL  Sleep:  Good   Screenings: AIMS    Flowsheet Row Clinical Support from 07/19/2024 in [facility] Clinical Support from 05/18/2024 in [facility]  AIMS Total Score 0 0   GAD-7    Flowsheet Row Clinical Support from 07/19/2024 in [facility] Clinical Support from 05/18/2024 in [facility] Office Visit from 04/06/2024 in [facility]  Total GAD-7 Score 12 0 14   PHQ2-9    Flowsheet Row Clinical Support from 07/19/2024 in [facility] Clinical Support from 05/18/2024 in [facility] Office Visit from 04/06/2024 in [place]  PHQ-2 Total Score 4 2 4   PHQ-9 Total Score 12 2 18    Flowsheet Row Clinical Support from 07/19/2024 in [facility] Clinical Support from 05/18/2024 in [facility] from 05/01/2024 in [facility] RISK CATEGORY No Risk No Risk No Risk     Assessment and Plan:   Ivor Kishi. Delage is a 69 year old male with a past psychiatric history significant for psychosis (unspecified psychosis type) who presents to [facility] for follow-up and medication management.  Patient presents to the encounter stating that he continues to take his Abilify  regularly and denies experiencing any adverse side effects.  An aims assessment was performed with the patient scoring a 0.  Patient denies overt depressive symptoms nor does he endorse anxiety.  A PHQ 9 screen was performed with the patient scoring a 12.  A GAD-7 screen was also performed with the patient scoring a 12.  Patient denies changes in his behavior nor does he endorse paranoia.  Patient does not appear to be responding to internal/external stimuli.  Patient endorses stability through the use of his Abilify .  Patient to continue taking the medication as prescribed.  Patient's medication to be e-prescribed to pharmacy of choice.  Due to patient's use of Abilify , the following labs to be obtained from the patient: Comprehensive metabolic panel, complete blood count with differential, lipid profile, and hemoglobin A1c.  Provider to schedule the patient for an EKG appointment during his next assessment.  A [place] Suicide Severity Rating Scale was performed with the patient being considered no risk.  Patient denies suicidal ideations and is able to contract for safety at this time.    Collaboration of Care: Collaboration of Care: Medication Management AEB provider managing patient's psychiatric  medications, Primary Care Provider AEB patient being seen by a primary care provider, and Psychiatrist AEB  patient being followed by mental health provider at this facility  Patient/Guardian was advised Release of Information must be obtained prior to any record release in order to collaborate their care with an outside provider. Patient/Guardian was advised if they have not already done so to contact the registration department to sign all necessary forms in order for us  to release information regarding their care.   Consent: Patient/Guardian gives verbal consent for treatment and assignment of benefits for services provided during this visit. Patient/Guardian expressed understanding and agreed to proceed.   1. Psychosis, unspecified psychosis type (HCC)  -  ARIPiprazole  (ABILIFY ) 10 MG tablet; Take 1 tablet (10 mg total) by mouth daily.  Dispense: 30 tablet; Refill: 2  Patient to follow-up in 2 months Provider spent a total of 15 minutes with the patient/reviewing patient's chart  Reginia FORBES Bolster, PA 07/19/2024, 3:00 PM

## 2024-09-20 ENCOUNTER — Encounter (HOSPITAL_COMMUNITY): Admitting: Physician Assistant
